# Patient Record
Sex: Male | Born: 1975 | Hispanic: Yes | Marital: Married | Smoking: Never smoker
Health system: Southern US, Community
[De-identification: ages and names within clinical notes are randomized; demographics above are authoritative.]

## PROBLEM LIST (undated history)

## (undated) DIAGNOSIS — E119 Type 2 diabetes mellitus without complications: Secondary | ICD-10-CM

## (undated) DIAGNOSIS — J189 Pneumonia, unspecified organism: Secondary | ICD-10-CM

## (undated) DIAGNOSIS — J069 Acute upper respiratory infection, unspecified: Secondary | ICD-10-CM

---
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
## 2017-08-27 DIAGNOSIS — N3000 Acute cystitis without hematuria: Secondary | ICD-10-CM | POA: Diagnosis not present

## 2018-10-24 ENCOUNTER — Encounter (HOSPITAL_COMMUNITY): Payer: Self-pay | Admitting: Emergency Medicine

## 2018-10-24 ENCOUNTER — Inpatient Hospital Stay (HOSPITAL_COMMUNITY)
Admission: EM | Admit: 2018-10-24 | Discharge: 2018-10-31 | DRG: 177 | Disposition: A | Discharge status: Home / Self Care | Payer: HRSA Program | Attending: Internal Medicine | Admitting: Internal Medicine

## 2018-10-24 ENCOUNTER — Other Ambulatory Visit: Payer: Self-pay

## 2018-10-24 DIAGNOSIS — Y92239 Unspecified place in hospital as the place of occurrence of the external cause: Secondary | ICD-10-CM | POA: Diagnosis not present

## 2018-10-24 DIAGNOSIS — A419 Sepsis, unspecified organism: Secondary | ICD-10-CM | POA: Diagnosis present

## 2018-10-24 DIAGNOSIS — Z888 Allergy status to other drugs, medicaments and biological substances status: Secondary | ICD-10-CM

## 2018-10-24 DIAGNOSIS — D696 Thrombocytopenia, unspecified: Secondary | ICD-10-CM | POA: Diagnosis present

## 2018-10-24 DIAGNOSIS — U071 COVID-19: Secondary | ICD-10-CM | POA: Diagnosis not present

## 2018-10-24 DIAGNOSIS — J1289 Other viral pneumonia: Secondary | ICD-10-CM | POA: Diagnosis present

## 2018-10-24 DIAGNOSIS — J9601 Acute respiratory failure with hypoxia: Secondary | ICD-10-CM | POA: Diagnosis present

## 2018-10-24 DIAGNOSIS — J069 Acute upper respiratory infection, unspecified: Secondary | ICD-10-CM | POA: Diagnosis present

## 2018-10-24 DIAGNOSIS — T380X5A Adverse effect of glucocorticoids and synthetic analogues, initial encounter: Secondary | ICD-10-CM | POA: Diagnosis not present

## 2018-10-24 DIAGNOSIS — Z8249 Family history of ischemic heart disease and other diseases of the circulatory system: Secondary | ICD-10-CM

## 2018-10-24 DIAGNOSIS — R7989 Other specified abnormal findings of blood chemistry: Secondary | ICD-10-CM | POA: Diagnosis present

## 2018-10-24 DIAGNOSIS — R945 Abnormal results of liver function studies: Secondary | ICD-10-CM | POA: Diagnosis present

## 2018-10-24 DIAGNOSIS — A4189 Other specified sepsis: Secondary | ICD-10-CM | POA: Diagnosis present

## 2018-10-24 DIAGNOSIS — E1165 Type 2 diabetes mellitus with hyperglycemia: Secondary | ICD-10-CM | POA: Diagnosis present

## 2018-10-24 DIAGNOSIS — Z833 Family history of diabetes mellitus: Secondary | ICD-10-CM

## 2018-10-24 NOTE — ED Triage Notes (Signed)
Reports sob and cough since last Thursday.  Tested positive for covid.

## 2018-10-24 NOTE — ED Provider Notes (Signed)
[facility] EMERGENCY DEPARTMENT Provider Note   CSN: [id number] Arrival date & time: 10/24/18  2314    History   Chief Complaint Chief Complaint  Patient presents with  . Shortness of Breath  . Cough    HPI Scott Nicholson is a 43 y.o. male with no pertinent past medical history who presents to the emergency department with a chief complaint of shortness of breath.  Patient reports that both he and his wife tested positive for COVID-19 on 6/22.  He reports that he began having symptoms 8 days ago.  Over the last week, he reports fever, chills, cough, body aches, and minimal shortness of breath.  He reports that over the last 24 hours that his shortness of breath has significantly increased.  He reports that he is getting short of breath walking from room to room.  He reports that his worsening shortness of breath is accompanied by persistent pressure-like chest pain.  Chest pain is worse with coughing episodes, but he denies exertional or pleuritic chest pain.  He also reports that his fever has been persistently higher.  He has been treating it with Tylenol and took 1 g of Tylenol approximately 40 minutes prior to arrival in the ER.  He denies leg swelling, orthopnea, nausea, vomiting, diarrhea, abdominal pain, neck pain or stiffness.   He and his wife reside in [place].  He is a never smoker.     The history is provided by the patient. No language interpreter was used.    History reviewed. No pertinent past medical history.  Patient Active Problem List   Diagnosis Date Noted  . Acute respiratory disease due to COVID-19 virus 10/25/2018  . Sepsis ([place]) 10/25/2018  . Abnormal LFTs 10/25/2018  . Thrombocytopenia ([place]) 10/25/2018    History reviewed. No pertinent surgical history.      Home Medications    Prior to Admission medications   Medication Sig Start Date End Date Taking? Authorizing Provider  acetaminophen (TYLENOL) 500 MG tablet Take 1,000 mg by  mouth every 6 (six) hours as needed for fever.   Yes [provider]    Family History Family History  Problem Relation Age of Onset  . Diabetes Mellitus II Mother   . Hypertension Mother     Social History Social History   Tobacco Use  . Smoking status: Never Smoker  . Smokeless tobacco: Never Used  Substance Use Topics  . Alcohol use: Yes    Frequency: Never    Comment: rarely  . Drug use: Never     Allergies   Patient has no known allergies.   Review of Systems Review of Systems  Constitutional: Positive for chills and fever. Negative for appetite change.  Respiratory: Positive for cough and shortness of breath.   Cardiovascular: Positive for chest pain. Negative for palpitations and leg swelling.  Gastrointestinal: Negative for abdominal pain, diarrhea, nausea and vomiting.  Genitourinary: Negative for dysuria.  Musculoskeletal: Negative for back pain.  Skin: Negative for rash.  Allergic/Immunologic: Negative for immunocompromised state.  Neurological: Negative for headaches.  Psychiatric/Behavioral: Negative for confusion.   Physical Exam Updated Vital Signs BP 132/71   Pulse (!) 101   Temp (!) 100.9 F (38.3 C) (Oral)   Resp (!) 25   Ht 5\' 10"  (1.778 m)   Wt 83.9 kg   SpO2 91%   BMI 26.54 kg/m   Physical Exam Vitals signs and nursing note reviewed.  Constitutional:      General: He is  not in acute distress.    Appearance: He is well-developed. He is not ill-appearing, toxic-appearing or diaphoretic.  HENT:     Head: Normocephalic.  Eyes:     Conjunctiva/sclera: Conjunctivae normal.  Neck:     Musculoskeletal: Normal range of motion and neck supple.  Cardiovascular:     Rate and Rhythm: Regular rhythm. Tachycardia present.     Heart sounds: No murmur.  Pulmonary:     Effort: Pulmonary effort is normal.     Comments: Significantly diminished breath sounds throughout.  Lungs are otherwise clear to auscultation bilaterally.  No  retractions or accessory muscle use. Abdominal:     General: There is no distension.     Palpations: Abdomen is soft. There is no mass.     Tenderness: There is no abdominal tenderness. There is no right CVA tenderness, left CVA tenderness, guarding or rebound.     Hernia: No hernia is present.  Musculoskeletal: Normal range of motion.     Right lower leg: No edema.     Left lower leg: No edema.  Skin:    General: Skin is warm and dry.     Capillary Refill: Capillary refill takes less than 2 seconds.     Coloration: Skin is not jaundiced or pale.     Findings: No bruising or rash.  Neurological:     Mental Status: He is alert.  Psychiatric:        Behavior: Behavior normal.      ED Treatments / Results  Labs (all labs ordered are listed, but only abnormal results are displayed) Labs Reviewed  CBC WITH DIFFERENTIAL/PLATELET - Abnormal; Notable for the following components:      Result Value   Platelets 127 (*)    Neutro Abs 7.9 (*)    All other components within normal limits  COMPREHENSIVE METABOLIC PANEL - Abnormal; Notable for the following components:   Glucose, Bld 138 (*)    Calcium 8.2 (*)    ALT 50 (*)    All other components within normal limits  D-DIMER, QUANTITATIVE (NOT AT [facility]) - Abnormal; Notable for the following components:   D-Dimer, Quant 1.09 (*)    All other components within normal limits  LACTATE DEHYDROGENASE - Abnormal; Notable for the following components:   LDH 234 (*)    All other components within normal limits  FERRITIN - Abnormal; Notable for the following components:   Ferritin 757 (*)    All other components within normal limits  FIBRINOGEN - Abnormal; Notable for the following components:   Fibrinogen 680 (*)    All other components within normal limits  C-REACTIVE PROTEIN - Abnormal; Notable for the following components:   CRP 16.5 (*)    All other components within normal limits  URINALYSIS, ROUTINE W REFLEX MICROSCOPIC - Abnormal;  Notable for the following components:   Color, Urine AMBER (*)    APPearance HAZY (*)    Protein, ur 100 (*)    All other components within normal limits  CULTURE, BLOOD (ROUTINE X 2)  CULTURE, BLOOD (ROUTINE X 2)  RESPIRATORY PANEL BY PCR  EXPECTORATED SPUTUM ASSESSMENT W REFEX TO RESP CULTURE  LACTIC ACID, PLASMA  PROCALCITONIN  TRIGLYCERIDES  STREP PNEUMONIAE URINARY ANTIGEN  LACTIC ACID, PLASMA  INFLUENZA PANEL BY PCR (TYPE A & B)  LEGIONELLA PNEUMOPHILA SEROGP 1 UR AG  HIV ANTIBODY (ROUTINE TESTING W REFLEX)  BRAIN NATRIURETIC PEPTIDE  HEPATITIS B SURFACE ANTIGEN  INTERLEUKIN-6, PLASMA  SEDIMENTATION RATE  TROPONIN I (  HIGH SENSITIVITY)  TROPONIN I (HIGH SENSITIVITY)  TYPE AND SCREEN    EKG None  Radiology Dg Chest Portable 1 View  Result Date: 10/25/2018 CLINICAL DATA:  Shortness of breath EXAM: PORTABLE CHEST 1 VIEW COMPARISON:  None. FINDINGS: There are multifocal airspace opacities bilaterally. The heart size is normal. There is no pneumothorax. No large pleural effusion. No acute osseous abnormality. No large pleural effusion. IMPRESSION: Multifocal airspace opacities concerning for multifocal pneumonia (viral or bacterial). Electronically Signed   By: Katherine Mantlehristopher  Green M.D.   On: 10/25/2018 00:31    Procedures Procedures (including critical care time)  Medications Ordered in ED Medications  AeroChamber Plus Flo-Vu Large MISC (has no administration in time range)  dextromethorphan-guaiFENesin (MUCINEX DM) 30-600 MG per 12 hr tablet 1 tablet (has no administration in time range)  cefTRIAXone (ROCEPHIN) 1 g in sodium chloride 0.9 % 100 mL IVPB (has no administration in time range)  azithromycin (ZITHROMAX) 500 mg in sodium chloride 0.9 % 250 mL IVPB (has no administration in time range)  enoxaparin (LOVENOX) injection 40 mg (has no administration in time range)  acetaminophen (TYLENOL) tablet 650 mg (650 mg Oral Given 10/25/18 0648)  ondansetron (ZOFRAN) tablet 4  mg (has no administration in time range)    Or  ondansetron (ZOFRAN) injection 4 mg (has no administration in time range)  ipratropium (ATROVENT HFA) inhaler 2 puff (has no administration in time range)  levalbuterol (XOPENEX HFA) inhaler 2 puff (has no administration in time range)  methylPREDNISolone sodium succinate (SOLU-MEDROL) 125 mg/2 mL injection 60 mg (has no administration in time range)  vitamin C (ASCORBIC ACID) tablet 1,000 mg (has no administration in time range)  cholecalciferol (VITAMIN D3) tablet 1,000 Units (has no administration in time range)  zinc sulfate capsule 220 mg (has no administration in time range)  albuterol (VENTOLIN HFA) 108 (90 Base) MCG/ACT inhaler 8 puff (8 puffs Inhalation Given 10/25/18 0156)  AeroChamber Plus Flo-Vu Large MISC 1 each (1 each Other Given 10/25/18 0156)  guaiFENesin (ROBITUSSIN) 100 MG/5ML solution 100 mg (100 mg Oral Given 10/25/18 0156)     Initial Impression / Assessment and Plan / ED Course  I have reviewed the triage vital signs and the nursing notes.  Pertinent labs & imaging results that were available during my care of the patient were reviewed by me and considered in my medical decision making (see chart for details).        43 year old male with no pertinent past medical history presenting with worsening shortness of breath and chest pain after being diagnosed with COVID-19 earlier this week.  The patient reports that his wife was also diagnosed with COVID-19 on 6/22.   Febrile to 101.1 on arrival.  Initial SaO2 was 95% on room air, but patient has gradually decreased since arrival to 92 to 93% at rest.  He has become progressively tachypneic, but has remained normotensive and without tachycardia.  He is currently not hypoxic on room air at rest.  On exam, breath sounds are diminished bilaterally in all fields.  However, patient is not in respiratory distress and does not have accessory muscle use or retractions.   Chest x-ray  with multifocal airspace opacities concerning for multifocal pneumonia.  Labs are notable for elevated LDH, ferritin, CRP, d-dimer, and fibrinogen.   I am concerned for further clinical decompensation given how rapidly the patient's symptoms have worsened over the last 24 hours.  Consult to the hospitalist team and spoke with Dr. Clyde LundborgNiu who will accept the  patient for admission. The patient appears reasonably stabilized for admission considering the current resources, flow, and capabilities available in the ED at this time, and I doubt any other [facility] requiring further screening and/or treatment in the ED prior to admission.   Final Clinical Impressions(s) / ED Diagnoses   Final diagnoses:  Acute respiratory disease due to COVID-19 virus    ED Discharge Orders    None       Barkley BoardsMcDonald, Cordarryl Monrreal A, PA-C 10/25/18 [id number]    Glynn Octaveancour, Stephen, MD 10/25/18 1926

## 2018-10-25 ENCOUNTER — Emergency Department (HOSPITAL_COMMUNITY): Payer: HRSA Program

## 2018-10-25 ENCOUNTER — Other Ambulatory Visit: Payer: Self-pay

## 2018-10-25 ENCOUNTER — Encounter (HOSPITAL_COMMUNITY): Payer: Self-pay | Admitting: Internal Medicine

## 2018-10-25 DIAGNOSIS — T380X5A Adverse effect of glucocorticoids and synthetic analogues, initial encounter: Secondary | ICD-10-CM | POA: Diagnosis not present

## 2018-10-25 DIAGNOSIS — Z833 Family history of diabetes mellitus: Secondary | ICD-10-CM | POA: Diagnosis not present

## 2018-10-25 DIAGNOSIS — J9601 Acute respiratory failure with hypoxia: Secondary | ICD-10-CM | POA: Diagnosis present

## 2018-10-25 DIAGNOSIS — R945 Abnormal results of liver function studies: Secondary | ICD-10-CM | POA: Diagnosis present

## 2018-10-25 DIAGNOSIS — A419 Sepsis, unspecified organism: Secondary | ICD-10-CM | POA: Diagnosis not present

## 2018-10-25 DIAGNOSIS — J069 Acute upper respiratory infection, unspecified: Secondary | ICD-10-CM | POA: Diagnosis present

## 2018-10-25 DIAGNOSIS — J1289 Other viral pneumonia: Secondary | ICD-10-CM | POA: Diagnosis present

## 2018-10-25 DIAGNOSIS — D696 Thrombocytopenia, unspecified: Secondary | ICD-10-CM | POA: Diagnosis present

## 2018-10-25 DIAGNOSIS — U071 COVID-19: Secondary | ICD-10-CM | POA: Diagnosis present

## 2018-10-25 DIAGNOSIS — Z888 Allergy status to other drugs, medicaments and biological substances status: Secondary | ICD-10-CM | POA: Diagnosis not present

## 2018-10-25 DIAGNOSIS — E1165 Type 2 diabetes mellitus with hyperglycemia: Secondary | ICD-10-CM | POA: Diagnosis present

## 2018-10-25 DIAGNOSIS — Z8249 Family history of ischemic heart disease and other diseases of the circulatory system: Secondary | ICD-10-CM | POA: Diagnosis not present

## 2018-10-25 DIAGNOSIS — R7989 Other specified abnormal findings of blood chemistry: Secondary | ICD-10-CM | POA: Diagnosis present

## 2018-10-25 DIAGNOSIS — A4189 Other specified sepsis: Secondary | ICD-10-CM | POA: Diagnosis present

## 2018-10-25 DIAGNOSIS — Y92239 Unspecified place in hospital as the place of occurrence of the external cause: Secondary | ICD-10-CM | POA: Diagnosis not present

## 2018-10-25 HISTORY — DX: COVID-19: U07.1

## 2018-10-25 HISTORY — DX: Acute upper respiratory infection, unspecified: J06.9

## 2018-10-25 LAB — LACTATE DEHYDROGENASE: LDH: 234 U/L — ABNORMAL HIGH (ref 98–192)

## 2018-10-25 LAB — C-REACTIVE PROTEIN: CRP: 16.5 mg/dL — ABNORMAL HIGH (ref <1.0)

## 2018-10-25 LAB — TROPONIN I (HIGH SENSITIVITY)
Troponin I (High Sensitivity): 4 ng/L (ref <18)
Troponin I (High Sensitivity): 5 ng/L (ref <18)

## 2018-10-25 LAB — CBC WITH DIFFERENTIAL/PLATELET
Abs Immature Granulocytes: 0.04 10*3/uL (ref 0.00–0.07)
Basophils Absolute: 0 10*3/uL (ref 0.0–0.1)
Basophils Relative: 0 %
Eosinophils Absolute: 0 10*3/uL (ref 0.0–0.5)
Eosinophils Relative: 0 %
HCT: 46.4 % (ref 39.0–52.0)
Hemoglobin: 15.4 g/dL (ref 13.0–17.0)
Immature Granulocytes: 0 %
Lymphocytes Relative: 17 %
Lymphs Abs: 1.7 10*3/uL (ref 0.7–4.0)
MCH: 29.3 pg (ref 26.0–34.0)
MCHC: 33.2 g/dL (ref 30.0–36.0)
MCV: 88.4 fL (ref 80.0–100.0)
Monocytes Absolute: 0.5 10*3/uL (ref 0.1–1.0)
Monocytes Relative: 5 %
Neutro Abs: 7.9 10*3/uL — ABNORMAL HIGH (ref 1.7–7.7)
Neutrophils Relative %: 78 %
Platelets: 127 10*3/uL — ABNORMAL LOW (ref 150–400)
RBC: 5.25 MIL/uL (ref 4.22–5.81)
RDW: 12.1 % (ref 11.5–15.5)
WBC: 10.2 10*3/uL (ref 4.0–10.5)
nRBC: 0 % (ref 0.0–0.2)

## 2018-10-25 LAB — PROCALCITONIN: Procalcitonin: 0.15 ng/mL

## 2018-10-25 LAB — TRIGLYCERIDES: Triglycerides: 80 mg/dL (ref <150)

## 2018-10-25 LAB — TYPE AND SCREEN
ABO/RH(D): O POS
ABO/RH(D): O POS
Antibody Screen: NEGATIVE
Antibody Screen: NEGATIVE

## 2018-10-25 LAB — D-DIMER, QUANTITATIVE: D-Dimer, Quant: 1.09 ug/mL-FEU — ABNORMAL HIGH (ref 0.00–0.50)

## 2018-10-25 LAB — BRAIN NATRIURETIC PEPTIDE: B Natriuretic Peptide: 24.9 pg/mL (ref 0.0–100.0)

## 2018-10-25 LAB — URINALYSIS, ROUTINE W REFLEX MICROSCOPIC
Bacteria, UA: NONE SEEN
Bilirubin Urine: NEGATIVE
Glucose, UA: NEGATIVE mg/dL
Hgb urine dipstick: NEGATIVE
Ketones, ur: NEGATIVE mg/dL
Leukocytes,Ua: NEGATIVE
Nitrite: NEGATIVE
Protein, ur: 100 mg/dL — AB
Specific Gravity, Urine: 1.03 (ref 1.005–1.030)
pH: 5 (ref 5.0–8.0)

## 2018-10-25 LAB — COMPREHENSIVE METABOLIC PANEL
ALT: 50 U/L — ABNORMAL HIGH (ref 0–44)
AST: 40 U/L (ref 15–41)
Albumin: 3.6 g/dL (ref 3.5–5.0)
Alkaline Phosphatase: 58 U/L (ref 38–126)
Anion gap: 11 (ref 5–15)
BUN: 8 mg/dL (ref 6–20)
CO2: 24 mmol/L (ref 22–32)
Calcium: 8.2 mg/dL — ABNORMAL LOW (ref 8.9–10.3)
Chloride: 100 mmol/L (ref 98–111)
Creatinine, Ser: 1.18 mg/dL (ref 0.61–1.24)
GFR calc Af Amer: >60 mL/min (ref >60)
GFR calc non Af Amer: >60 mL/min (ref >60)
Glucose, Bld: 138 mg/dL — ABNORMAL HIGH (ref 70–99)
Potassium: 4.2 mmol/L (ref 3.5–5.1)
Sodium: 135 mmol/L (ref 135–145)
Total Bilirubin: 0.8 mg/dL (ref 0.3–1.2)
Total Protein: 7.9 g/dL (ref 6.5–8.1)

## 2018-10-25 LAB — LACTIC ACID, PLASMA
Lactic Acid, Venous: 1 mmol/L (ref 0.5–1.9)
Lactic Acid, Venous: 1.6 mmol/L (ref 0.5–1.9)

## 2018-10-25 LAB — FERRITIN: Ferritin: 757 ng/mL — ABNORMAL HIGH (ref 24–336)

## 2018-10-25 LAB — HIV ANTIBODY (ROUTINE TESTING W REFLEX): HIV Screen 4th Generation wRfx: NONREACTIVE

## 2018-10-25 LAB — SEDIMENTATION RATE: Sed Rate: 23 mm/hr — ABNORMAL HIGH (ref 0–16)

## 2018-10-25 LAB — ABO/RH: ABO/RH(D): O POS

## 2018-10-25 LAB — STREP PNEUMONIAE URINARY ANTIGEN: Strep Pneumo Urinary Antigen: NEGATIVE

## 2018-10-25 LAB — FIBRINOGEN: Fibrinogen: 680 mg/dL — ABNORMAL HIGH (ref 210–475)

## 2018-10-25 MED ORDER — ACETAMINOPHEN 325 MG PO TABS
650.0000 mg | ORAL_TABLET | Freq: Four times a day (QID) | ORAL | Status: DC | PRN
  Administered 2018-10-25: 650 mg via ORAL
  Filled 2018-10-25: qty 2

## 2018-10-25 MED ORDER — ENOXAPARIN SODIUM 40 MG/0.4ML ~~LOC~~ SOLN
40.0000 mg | SUBCUTANEOUS | Status: DC
  Administered 2018-10-25 – 2018-10-31 (×7): 40 mg via SUBCUTANEOUS
  Filled 2018-10-25 (×7): qty 0.4

## 2018-10-25 MED ORDER — ONDANSETRON HCL 4 MG PO TABS: 4.0000 mg | ORAL_TABLET | Freq: Four times a day (QID) | ORAL | Status: DC | PRN

## 2018-10-25 MED ORDER — IPRATROPIUM BROMIDE HFA 17 MCG/ACT IN AERS: 2.0000 | INHALATION_SPRAY | RESPIRATORY_TRACT | Status: DC

## 2018-10-25 MED ORDER — LEVALBUTEROL TARTRATE 45 MCG/ACT IN AERO: 2.0000 | INHALATION_SPRAY | Freq: Four times a day (QID) | RESPIRATORY_TRACT | Status: DC | PRN

## 2018-10-25 MED ORDER — AEROCHAMBER PLUS FLO-VU LARGE MISC
1.0000 | Freq: Once | Status: AC
  Administered 2018-10-25: 1

## 2018-10-25 MED ORDER — AEROCHAMBER PLUS FLO-VU LARGE MISC
Status: AC
  Filled 2018-10-25: qty 1

## 2018-10-25 MED ORDER — METHYLPREDNISOLONE SODIUM SUCC 125 MG IJ SOLR
60.0000 mg | Freq: Two times a day (BID) | INTRAMUSCULAR | Status: DC
  Administered 2018-10-25 – 2018-10-31 (×13): 60 mg via INTRAVENOUS
  Filled 2018-10-25 (×13): qty 2

## 2018-10-25 MED ORDER — ZINC SULFATE 220 (50 ZN) MG PO CAPS
220.0000 mg | ORAL_CAPSULE | Freq: Every day | ORAL | Status: DC
  Administered 2018-10-25 – 2018-10-31 (×7): 220 mg via ORAL
  Filled 2018-10-25 (×7): qty 1

## 2018-10-25 MED ORDER — DM-GUAIFENESIN ER 30-600 MG PO TB12
1.0000 | ORAL_TABLET | Freq: Two times a day (BID) | ORAL | Status: DC | PRN
  Administered 2018-10-26 – 2018-10-28 (×4): 1 via ORAL
  Filled 2018-10-25 (×4): qty 1

## 2018-10-25 MED ORDER — LEVALBUTEROL TARTRATE 45 MCG/ACT IN AERO
2.0000 | INHALATION_SPRAY | Freq: Three times a day (TID) | RESPIRATORY_TRACT | Status: DC
  Administered 2018-10-25 – 2018-10-31 (×18): 2 via RESPIRATORY_TRACT
  Filled 2018-10-25: qty 15

## 2018-10-25 MED ORDER — SODIUM CHLORIDE 0.9 % IV SOLN
1.0000 g | INTRAVENOUS | Status: DC
  Administered 2018-10-25: 1 g via INTRAVENOUS
  Filled 2018-10-25: qty 10

## 2018-10-25 MED ORDER — VITAMIN C 500 MG PO TABS
1000.0000 mg | ORAL_TABLET | Freq: Every day | ORAL | Status: DC
  Administered 2018-10-25 – 2018-10-31 (×7): 1000 mg via ORAL
  Filled 2018-10-25 (×7): qty 2

## 2018-10-25 MED ORDER — ONDANSETRON HCL 4 MG/2ML IJ SOLN: 4.0000 mg | Freq: Four times a day (QID) | INTRAMUSCULAR | Status: DC | PRN

## 2018-10-25 MED ORDER — GUAIFENESIN 100 MG/5ML PO SOLN
5.0000 mL | Freq: Once | ORAL | Status: AC
  Administered 2018-10-25: 100 mg via ORAL
  Filled 2018-10-25: qty 5

## 2018-10-25 MED ORDER — VITAMIN D 25 MCG (1000 UNIT) PO TABS
1000.0000 [IU] | ORAL_TABLET | Freq: Every day | ORAL | Status: DC
  Administered 2018-10-26 – 2018-10-31 (×6): 1000 [IU] via ORAL
  Filled 2018-10-25 (×6): qty 1

## 2018-10-25 MED ORDER — SODIUM CHLORIDE 0.9 % IV SOLN
500.0000 mg | INTRAVENOUS | Status: DC
  Administered 2018-10-25: 500 mg via INTRAVENOUS
  Filled 2018-10-25: qty 500

## 2018-10-25 MED ORDER — IPRATROPIUM BROMIDE HFA 17 MCG/ACT IN AERS
2.0000 | INHALATION_SPRAY | Freq: Three times a day (TID) | RESPIRATORY_TRACT | Status: DC
  Administered 2018-10-25 – 2018-10-31 (×19): 2 via RESPIRATORY_TRACT
  Filled 2018-10-25: qty 12.9

## 2018-10-25 MED ORDER — ALBUTEROL SULFATE HFA 108 (90 BASE) MCG/ACT IN AERS
8.0000 | INHALATION_SPRAY | Freq: Once | RESPIRATORY_TRACT | Status: AC
  Administered 2018-10-25: 02:00:00 8 via RESPIRATORY_TRACT
  Filled 2018-10-25: qty 6.7

## 2018-10-25 NOTE — H&P (Signed)
History and Physical    Scott Nicholson ZOX:[id number] DOB: [birth year] DOA: 10/24/2018  Referring MD/NP/PA:   PCP: Patient, No Pcp Per   Patient coming from:  The patient is coming from home.  At baseline, pt is independent for most of ADL.        Chief Complaint: fever, SOB, cough  HPI: Scott DoffingFelipe Bulkley is a 43 y.o. male without significant medical history, who presents with fever, shortness breath and cough.  Patient speaks little Scott Nicholson, history is obtained with his wife's help who speaks good Scott Nicholson. He states that he has been having shortness of breath, dry cough, fever, chills, body aches, malaise in the past 8 days. Patient states that both he and his wife tested positive for COVID-19 on 6/22. He also reports intermittent chest pressure, currently he denies chest pain. He reports that over the last 24 hours that his shortness of breath has significantly worsened. He reports that he is getting short of breath walking from room to room. He also reports that his fever has been persistently high.  He has been treating himself with Tylenol, with some improvement. Patient denies nausea vomiting, diarrhea, abdominal pain, symptoms of UTI or unilateral weakness.   ED Course: pt was found to have WBC 10.2, negative urinalysis, electrolytes renal function okay, liver function (ALP 58, AST 40, ALT 50, total bilirubin 0.8), CRP 16.5, LDH 234, procalcitonin 0.015, ferritin 757, TG 80, temperature 100.9, tachycardia, tachypnea, oxygen saturation 92 to 95% on room air.  Chest x-ray showed multifocal infiltration.  Patient is admitted to telemetry bed as inpatient.  Review of Systems:   General: has fevers, chills, no body weight gain, has poor appetite, has fatigue HEENT: no blurry vision, hearing changes or sore throat Respiratory: has dyspnea, coughing, no  wheezing CV: has chest pressure, no palpitations GI: no nausea, vomiting, abdominal pain, diarrhea, constipation GU: no dysuria, burning on  urination, increased urinary frequency, hematuria  Ext: no leg edema Neuro: no unilateral weakness, numbness, or tingling, no vision change or hearing loss Skin: no rash, no skin tear. MSK: No muscle spasm, no deformity, no limitation of range of movement in spin Heme: No easy bruising.  Travel history: No recent long distant travel.  Allergy: No Known Allergies  History reviewed. No pertinent past medical history.  History reviewed. No pertinent surgical history.  Social History:  reports that he has never smoked. He has never used smokeless tobacco. He reports current alcohol use. He reports that he does not use drugs.  Family History:  Family History  Problem Relation Age of Onset  . Diabetes Mellitus II Mother   . Hypertension Mother      Prior to Admission medications   Medication Sig Start Date End Date Taking? Authorizing Provider  acetaminophen (TYLENOL) 500 MG tablet Take 1,000 mg by mouth every 6 (six) hours as needed for fever.   Yes [provider]    Physical Exam: Vitals:   10/25/18 0445 10/25/18 0500 10/25/18 0515 10/25/18 0530  BP:  139/71    Pulse: (!) 107 (!) 104 (!) 101 95  Resp: (!) 24 14 (!) 31 (!) 23  Temp:      TempSrc:      SpO2: 93% 92% 93% 92%  Weight:      Height:       General: Not in acute distress HEENT:       Eyes: PERRL, EOMI, no scleral icterus.       ENT: No discharge from the ears and  nose, no pharynx injection, no tonsillar enlargement.        Neck: No JVD, no bruit, no mass felt. Heme: No neck lymph node enlargement. Cardiac: S1/S2, RRR, No murmurs, No gallops or rubs. Respiratory: Decreased air movement bilaterally. No rales, wheezing, rhonchi or rubs. GI: Soft, nondistended, nontender, no rebound pain, no organomegaly, BS present. GU: No hematuria Ext: No pitting leg edema bilaterally. 2+DP/PT pulse bilaterally. Musculoskeletal: No joint deformities, No joint redness or warmth, no limitation of ROM in spin. Skin: No  rashes.  Neuro: Alert, oriented X3, cranial nerves II-XII grossly intact, moves all extremities normally.  Psych: Patient is not psychotic, no suicidal or hemocidal ideation.  Labs on Admission: I have personally reviewed following labs and imaging studies  CBC: Recent Labs  Lab 10/25/18 0144  WBC 10.2  NEUTROABS 7.9*  HGB 15.4  HCT 46.4  MCV 88.4  PLT 127*   Basic Metabolic Panel: Recent Labs  Lab 10/25/18 0144  NA 135  K 4.2  CL 100  CO2 24  GLUCOSE 138*  BUN 8  CREATININE 1.18  CALCIUM 8.2*   GFR: Estimated Creatinine Clearance: 84.2 mL/min (by C-G formula based on SCr of 1.18 mg/dL). Liver Function Tests: Recent Labs  Lab 10/25/18 0144  AST 40  ALT 50*  ALKPHOS 58  BILITOT 0.8  PROT 7.9  ALBUMIN 3.6   No results for input(s): LIPASE, AMYLASE in the last 168 hours. No results for input(s): AMMONIA in the last 168 hours. Coagulation Profile: No results for input(s): INR, PROTIME in the last 168 hours. Cardiac Enzymes: No results for input(s): CKTOTAL, CKMB, CKMBINDEX, TROPONINI in the last 168 hours. BNP (last 3 results) No results for input(s): PROBNP in the last 8760 hours. HbA1C: No results for input(s): HGBA1C in the last 72 hours. CBG: No results for input(s): GLUCAP in the last 168 hours. Lipid Profile: Recent Labs    10/25/18 0144  TRIG 80   Thyroid Function Tests: No results for input(s): TSH, T4TOTAL, FREET4, T3FREE, THYROIDAB in the last 72 hours. Anemia Panel: Recent Labs    10/25/18 0144  FERRITIN 757*   Urine analysis:    Component Value Date/Time   COLORURINE AMBER (A) 10/25/2018 0143   APPEARANCEUR HAZY (A) 10/25/2018 0143   LABSPEC 1.030 10/25/2018 0143   PHURINE 5.0 10/25/2018 0143   GLUCOSEU NEGATIVE 10/25/2018 0143   HGBUR NEGATIVE 10/25/2018 0143   BILIRUBINUR NEGATIVE 10/25/2018 0143   KETONESUR NEGATIVE 10/25/2018 0143   PROTEINUR 100 (A) 10/25/2018 0143   NITRITE NEGATIVE 10/25/2018 0143   LEUKOCYTESUR NEGATIVE  10/25/2018 0143   Sepsis Labs: @LABRCNTIP (procalcitonin:4,lacticidven:4) ) Recent Results (from the past 240 hour(s))  Blood Culture (routine x 2)     Status: None (Preliminary result)   Collection Time: 10/25/18  1:47 AM   Specimen: BLOOD RIGHT HAND  Result Value Ref Range Status   Specimen Description BLOOD RIGHT HAND  Final   Special Requests   Final    AEROBIC BOTTLE ONLY Blood Culture results may not be optimal due to an excessive volume of blood received in culture bottles Performed at [facility], [address] [place], [place] [id number]    Culture PENDING  Incomplete   Report Status PENDING  Incomplete     Radiological Exams on Admission: Dg Chest Portable 1 View  Result Date: 10/25/2018 CLINICAL DATA:  Shortness of breath EXAM: PORTABLE CHEST 1 VIEW COMPARISON:  None. FINDINGS: There are multifocal airspace opacities bilaterally. The heart size is normal.  There is no pneumothorax. No large pleural effusion. No acute osseous abnormality. No large pleural effusion. IMPRESSION: Multifocal airspace opacities concerning for multifocal pneumonia (viral or bacterial). Electronically Signed   By: Constance Holster M.D.   On: 10/25/2018 00:31     EKG: Independently reviewed.  Sinus rhythm, QTC 439, LAE, T wave inversion in lead III/aVF, poor R wave progression   Assessment/Plan Principal Problem:   Acute respiratory disease due to COVID-19 virus Active Problems:   Sepsis ([facility])   Abnormal LFTs   Thrombocytopenia ([facility])   Sepsis and acute respiratory disease due to COVID-19 virus: Chest x-ray showed multifocal infiltration.  Patient meets criteria for sepsis with fever, tachycardia and tachypnea.  Lactic acid is normal 1.0.  Currently hemodynamically stable.  Cannot completely rule out bacterial pneumonia, will start antibiotics.    -will admit to tele as inpt -Atrovent inhaler, PRN Xopenex inhaler - Remdesivir per pharm -IV Rocephin and azithromycin -PRN Mucinex  for cough -Follow-up flu PCR and RVP -f/u Blood culture -Gentle IV fluid:  -D-dimer, BNP,Trop, LFT, CRP, LDH, Procalcitonin, IL-6, ferritin, fibinogen, TG, Hep B SAg, HIV ab -Daily CRP, Ferritin, D-dimer, -Will ask the patient to maintain an awake prone position for 16+ hours a day, if possible, with a minimum of 2-3 hours at a time - vitamin C, zinc.   -Will attempt to maintain euvolemia to a net negative fluid status -Patient was seen wearing full PPE including: gown, gloves, head cover, N95, and face shield  Abnormal LFTs: mild.  AST 40, ALT 50, total bilirubin 0.8.  Most likely due to COVID-19 infection. -Follow-up of blood CMP  Thrombocytopenia ([place]): Platelet 127.  Most likely due to ongoing infection.  No bleeding tendency. We will follow-up with CBC    Inpatient status:  # Patient requires inpatient status due to high intensity of service, high risk for further deterioration and high frequency of surveillance required.  I certify that at the point of admission it is my clinical judgment that the patient will require inpatient hospital care spanning beyond 2 midnights from the point of admission.  . Now patient has presenting with acute respiratory disease due to COVID-19 infection.  Patient also has sepsis. . The worrisome physical exam findings include decreased air movement bilaterally. . The initial radiographic and laboratory data are worrisome because chest x-ray showed multifocal infiltration.  Patient has sepsis, elevated inflammation marker . Current medical needs: please see my assessment and plan . Predictability of an adverse outcome (risk): Patient does not have multiple comorbidities, but he has severe symptoms of shortness of breath and cough.  Most likely due to COVID-19 infection. Patient also has sepis, and we cannot completely rule out bacterial pneumonia.  Patient has oxygen desaturation intermittently down to 92% on room air, patient is at high risk of  deteriorating.  Will need to be treated in hospital for at least 2 days.    DVT ppx:  SQ Lovenox Code Status: Full code Family Communication:  Yes, patient's wife Disposition Plan:  Anticipate discharge back to previous home environment Consults called:  none Admission status:   Inpatient/tele     Date of Service 10/25/2018    [facility]   If 7PM-7AM, please contact night-coverage [email or web address] Password [facility] 10/25/2018, 5:33 AM

## 2018-10-25 NOTE — Progress Notes (Addendum)
1033  Arrived to unit via EMS.  Vitals stable.  Monitor placed.  Alert and oriented x 4.  Resp e/u.  Dyspnea on exertion.  O2 sats 92-94% on room air.  Oriented to room/unit.  Voiced understanding.  Patient has updated family on arrival and condition.  1830  Up ambulating in room on room air.  Lowest O2 sat 88%.  Improving to 92% at rest.  Dry cough exacerbated by activity.

## 2018-10-25 NOTE — ED Notes (Signed)
1st LA 1.0

## 2018-10-25 NOTE — Plan of Care (Signed)
  Problem: Education: Goal: Knowledge of General Education information will improve Description: Including pain rating scale, medication(s)/side effects and non-pharmacologic comfort measures Outcome: Progressing   Problem: Health Behavior/Discharge Planning: Goal: Ability to manage health-related needs will improve Outcome: Progressing   Problem: Clinical Measurements: Goal: Ability to maintain clinical measurements within normal limits will improve Outcome: Progressing Goal: Diagnostic test results will improve Outcome: Progressing Goal: Cardiovascular complication will be avoided Outcome: Progressing   Problem: Nutrition: Goal: Adequate nutrition will be maintained Outcome: Progressing   Problem: Coping: Goal: Level of anxiety will decrease Outcome: Progressing   Problem: Elimination: Goal: Will not experience complications related to bowel motility Outcome: Progressing Goal: Will not experience complications related to urinary retention Outcome: Progressing   Problem: Pain Managment: Goal: General experience of comfort will improve Outcome: Progressing   Problem: Safety: Goal: Ability to remain free from injury will improve Outcome: Progressing   Problem: Skin Integrity: Goal: Risk for impaired skin integrity will decrease Outcome: Progressing   Problem: Education: Goal: Knowledge of risk factors and measures for prevention of condition will improve Outcome: Progressing   Problem: Coping: Goal: Psychosocial and spiritual needs will be supported Outcome: Progressing   Problem: Respiratory: Goal: Will maintain a patent airway Outcome: Progressing Goal: Complications related to the disease process, condition or treatment will be avoided or minimized Outcome: Progressing   Problem: Clinical Measurements: Goal: Respiratory complications will improve Outcome: Not Progressing Note: Day of admission   Problem: Activity: Goal: Risk for activity intolerance  will decrease Outcome: Not Progressing

## 2018-10-25 NOTE — Progress Notes (Signed)
Tyree Fluharty is a 43 y.o. male without significant medical history, who presents with fever, shortness breath and cough.  Patient speaks little [place], history is obtained with his wife's help who speaks good [place]. He states that he has been havingshortness of breath,drycough, fever, chills, body aches, malaise in the past 8 days.Patient states thatboth he and his wife tested positive for COVID-19 on 6/22. He also reports intermittent chest pressure, currently he denies chest pain. He reports that over the last 24 hours that his shortness of breath has significantly worsened.He reports that he is getting short of breath walking from room to room.He also reports that his fever has been persistently high. He has been treating himself with Tylenol, with some improvement. Patient denies nausea vomiting, diarrhea, abdominal pain, symptoms of UTI or unilateral weakness.   10/25/18: Patient seen in the ED this morning.  Heart rate elevated on monitor in the room up to 104.  In sinus tach.  Denies chest pain or palpitations.  No diarrhea this morning.  Started on IV Solu-Medrol 60 mg twice daily, inhalers 3 times daily, vitamin C, D3, and zinc.  Possible initiation and treatment with Remdesivir at [facility].   Please refer to H&P dictated by Dr. Blaine Hamper on 10/25/2018 for further details of the assessment and plan.

## 2018-10-25 NOTE — ED Notes (Signed)
Ordered bfast 

## 2018-10-25 NOTE — ED Notes (Signed)
ED TO INPATIENT HANDOFF REPORT  ED Nurse Name and Phone #:   S Name/Age/Gender Scott Nicholson 43 y.o. male Room/Bed: 024C/024C  Code Status   Code Status: Full Code  Home/SNF/Other Home Patient oriented to: self, place, time and situation Is this baseline? Yes   Triage Complete: Triage complete  Chief Complaint CoVid positive - cough  Triage Note Reports sob and cough since last Thursday.  Tested positive for covid.   Allergies No Known Allergies  Level of Care/Admitting Diagnosis ED Disposition    ED Disposition Condition Comment   Admit  Hospital Area: [facility] [[id number]]  Level of Care: Telemetry [5]  Covid Evaluation: Confirmed COVID Positive  Isolation Risk Level: Low Risk/Droplet (Less than 4L [place] supplementation)  Diagnosis: Acute respiratory disease due to COVID-19 virus [[id number]]  Admitting Physician: Lorretta Harp [[id number]]  Attending Physician: Lorretta Harp [[id number]]  Estimated length of stay: past midnight tomorrow  Certification:: I certify this patient will need inpatient services for at least 2 midnights  PT Class (Do Not Modify): Inpatient [101]  PT Acc Code (Do Not Modify): Private [1]       B Medical/Surgery History History reviewed. No pertinent past medical history. History reviewed. No pertinent surgical history.   A IV Location/Drains/Wounds Patient Lines/Drains/Airways Status   Active Line/Drains/Airways    Name:   Placement date:   Placement time:   Site:   Days:   Peripheral IV 10/25/18 Right Antecubital   10/25/18    0203    Antecubital   less than 1   Peripheral IV 10/25/18 Left Antecubital   10/25/18    0650    Antecubital   less than 1          Intake/Output Last 24 hours  Intake/Output Summary (Last 24 hours) at 10/25/2018 0981 Last data filed at 10/25/2018 0801 Gross per 24 hour  Intake 600 ml  Output -  Net 600 ml    Labs/Imaging Results for orders placed or performed during the hospital encounter of  10/24/18 (from the past 48 hour(s))  Blood Culture (routine x 2)     Status: None (Preliminary result)   Collection Time: 10/25/18  1:35 AM   Specimen: BLOOD RIGHT ARM  Result Value Ref Range   Specimen Description BLOOD RIGHT ARM    Special Requests      BOTTLES DRAWN AEROBIC AND ANAEROBIC Blood Culture adequate volume   Culture      NO GROWTH < 12 HOURS Performed at [facility], [address] [place], [place] [address]    Report Status PENDING   Urinalysis, Routine w reflex microscopic     Status: Abnormal   Collection Time: 10/25/18  1:43 AM  Result Value Ref Range   Color, Urine AMBER (A) YELLOW    Comment: BIOCHEMICALS MAY BE AFFECTED BY COLOR   APPearance HAZY (A) CLEAR   Specific Gravity, Urine 1.030 1.005 - 1.030   pH 5.0 5.0 - 8.0   Glucose, UA NEGATIVE NEGATIVE mg/dL   Hgb urine dipstick NEGATIVE NEGATIVE   Bilirubin Urine NEGATIVE NEGATIVE   Ketones, ur NEGATIVE NEGATIVE mg/dL   Protein, ur 829 (A) NEGATIVE mg/dL   Nitrite NEGATIVE NEGATIVE   Leukocytes,Ua NEGATIVE NEGATIVE   RBC / HPF 0-5 0 - 5 RBC/hpf   WBC, UA 0-5 0 - 5 WBC/hpf   Bacteria, UA NONE SEEN NONE SEEN   Mucus PRESENT     Comment: Performed at [facility], [address] [place], [place] [address]  Lactic acid, plasma     Status: None   Collection Time: 10/25/18  1:44 AM  Result Value Ref Range   Lactic Acid, Venous 1.0 0.5 - 1.9 mmol/L    Comment: Performed at [facility], [address] [place], [place] [address]  CBC WITH DIFFERENTIAL     Status: Abnormal   Collection Time: 10/25/18  1:44 AM  Result Value Ref Range   WBC 10.2 4.0 - 10.5 K/uL   RBC 5.25 4.22 - 5.81 MIL/uL   Hemoglobin 15.4 13.0 - 17.0 g/dL   HCT 09.846.4 11.939.0 - 14.752.0 %   MCV 88.4 80.0 - 100.0 fL   MCH 29.3 26.0 - 34.0 pg   MCHC 33.2 30.0 - 36.0 g/dL   RDW 82.912.1 56.211.5 - 13.015.5 %   Platelets 127 (L) 150 - 400 K/uL    Comment: REPEATED TO VERIFY   nRBC 0.0 0.0 - 0.2 %   Neutrophils Relative % 78 %   Neutro  Abs 7.9 (H) 1.7 - 7.7 K/uL   Lymphocytes Relative 17 %   Lymphs Abs 1.7 0.7 - 4.0 K/uL   Monocytes Relative 5 %   Monocytes Absolute 0.5 0.1 - 1.0 K/uL   Eosinophils Relative 0 %   Eosinophils Absolute 0.0 0.0 - 0.5 K/uL   Basophils Relative 0 %   Basophils Absolute 0.0 0.0 - 0.1 K/uL   Immature Granulocytes 0 %   Abs Immature Granulocytes 0.04 0.00 - 0.07 K/uL    Comment: Performed at [facility], [address] [place], [place] [address]  Comprehensive metabolic panel     Status: Abnormal   Collection Time: 10/25/18  1:44 AM  Result Value Ref Range   Sodium 135 135 - 145 mmol/L   Potassium 4.2 3.5 - 5.1 mmol/L   Chloride 100 98 - 111 mmol/L   CO2 24 22 - 32 mmol/L   Glucose, Bld 138 (H) 70 - 99 mg/dL   BUN 8 6 - 20 mg/dL   Creatinine, Ser 4.691.18 0.61 - 1.24 mg/dL   Calcium 8.2 (L) 8.9 - 10.3 mg/dL   Total Protein 7.9 6.5 - 8.1 g/dL   Albumin 3.6 3.5 - 5.0 g/dL   AST 40 15 - 41 U/L   ALT 50 (H) 0 - 44 U/L   Alkaline Phosphatase 58 38 - 126 U/L   Total Bilirubin 0.8 0.3 - 1.2 mg/dL   GFR calc non Af Amer >60 >60 mL/min   GFR calc Af Amer >60 >60 mL/min   Anion gap 11 5 - 15    Comment: Performed at [facility], [address] [place], [place] [address]  D-dimer, quantitative     Status: Abnormal   Collection Time: 10/25/18  1:44 AM  Result Value Ref Range   D-Dimer, Quant 1.09 (H) 0.00 - 0.50 ug/mL-FEU    Comment: (NOTE) At the manufacturer cut-off of 0.50 ug/mL FEU, this assay has been documented to exclude PE with a sensitivity and negative predictive value of 97 to 99%.  At this time, this assay has not been approved by the FDA to exclude DVT/VTE. Results should be correlated with clinical presentation. Performed at [facility], [address] [place], [place] [address]   Procalcitonin     Status: None   Collection Time: 10/25/18  1:44 AM  Result Value Ref Range   Procalcitonin 0.15 ng/mL    Comment:  Interpretation: PCT  (Procalcitonin) <= 0.5 ng/mL: Systemic infection (sepsis) is not likely. Local bacterial infection is possible. (NOTE)       Sepsis PCT Algorithm           Lower Respiratory Tract                                      Infection PCT Algorithm    ----------------------------     ----------------------------         PCT < 0.25 ng/mL                PCT < 0.10 ng/mL         Strongly encourage             Strongly discourage   discontinuation of antibiotics    initiation of antibiotics    ----------------------------     -----------------------------       PCT 0.25 - 0.50 ng/mL            PCT 0.10 - 0.25 ng/mL               OR       >80% decrease in PCT            Discourage initiation of                                            antibiotics      Encourage discontinuation           of antibiotics    ----------------------------     -----------------------------         PCT >= 0.50 ng/mL              PCT 0.26 - 0.50 ng/mL               AND        <80% decrease in PCT             Encourage initiation of                                             antibiotics       Encourage continuation           of antibiotics    ----------------------------     -----------------------------        PCT >= 0.50 ng/mL                  PCT > 0.50 ng/mL               AND         increase in PCT                  Strongly encourage                                      initiation of antibiotics    Strongly encourage escalation           of antibiotics                                     -----------------------------  PCT <= 0.25 ng/mL                                                 OR                                        > 80% decrease in PCT                                     Discontinue / Do not initiate                                             antibiotics Performed at [facility], [address] [place], [place] [address]   Lactate dehydrogenase      Status: Abnormal   Collection Time: 10/25/18  1:44 AM  Result Value Ref Range   LDH 234 (H) 98 - 192 U/L    Comment: Performed at [facility], [address] [place], [place] [address]  Ferritin     Status: Abnormal   Collection Time: 10/25/18  1:44 AM  Result Value Ref Range   Ferritin 757 (H) 24 - 336 ng/mL    Comment: Performed at [facility], [address] [place], [place] [address]  Triglycerides     Status: None   Collection Time: 10/25/18  1:44 AM  Result Value Ref Range   Triglycerides 80 <150 mg/dL    Comment: Performed at [facility], [address] [place], [place] [address]  Fibrinogen     Status: Abnormal   Collection Time: 10/25/18  1:44 AM  Result Value Ref Range   Fibrinogen 680 (H) 210 - 475 mg/dL    Comment: Performed at [facility], [address] [place], [place] [address]  C-reactive protein     Status: Abnormal   Collection Time: 10/25/18  1:44 AM  Result Value Ref Range   CRP 16.5 (H) <1.0 mg/dL    Comment: Performed at [facility], [address] [place], [place] [address]  Blood Culture (routine x 2)     Status: None (Preliminary result)   Collection Time: 10/25/18  1:47 AM   Specimen: BLOOD RIGHT HAND  Result Value Ref Range   Specimen Description BLOOD RIGHT HAND    Special Requests      AEROBIC BOTTLE ONLY Blood Culture results may not be optimal due to an excessive volume of blood received in culture bottles   Culture      NO GROWTH < 12 HOURS Performed at [facility], [address] [place], [place] [address]    Report Status PENDING   Strep pneumoniae urinary antigen     Status: None   Collection Time: 10/25/18  4:34 AM  Result Value Ref Range   Strep Pneumo Urinary Antigen NEGATIVE NEGATIVE    Comment:        Infection due to S. pneumoniae cannot be absolutely ruled out since the antigen present may be below the detection limit of the test.  Performed at [facility], [address] [place], [place] [address]   Lactic acid, plasma     Status: None   Collection Time: 10/25/18  6:10 AM  Result Value Ref Range   Lactic Acid, Venous 1.6 0.5 - 1.9 mmol/L    Comment: Performed at [facility], [address] [place], [place] [address]  Brain natriuretic peptide     Status: None   Collection Time: 10/25/18  6:10 AM  Result Value Ref Range   B Natriuretic Peptide 24.9 0.0 - 100.0 pg/mL    Comment: Performed at [facility], [address] [place], [place] [address]  Sedimentation rate     Status: Abnormal   Collection Time: 10/25/18  6:10 AM  Result Value Ref Range   Sed Rate 23 (H) 0 - 16 mm/hr    Comment: Performed at [facility], [address], [place], [place] [address]  Troponin I (High Sensitivity)     Status: None   Collection Time: 10/25/18  6:10 AM  Result Value Ref Range   Troponin I (High Sensitivity) 4 <18 ng/L    Comment: (NOTE) Elevated high sensitivity troponin I (hsTnI) values and significant  changes across serial measurements may suggest ACS but many other  chronic and acute conditions are known to elevate hsTnI results.  Refer to the "Links" section for chest pain algorithms and additional  guidance. Performed at [facility], [address], [place], [place] [address]   Type and screen [facility]     Status: None   Collection Time: 10/25/18  6:25 AM  Result Value Ref Range   ABO/RH(D) O POS    Antibody Screen NEG    Sample Expiration      10/28/2018,2359 Performed at [facility], [address] [place], [place] [address]   ABO/Rh     Status: None (Preliminary result)   Collection Time: 10/25/18  6:25 AM  Result Value Ref Range   ABO/RH(D)      O POS Performed at [facility], [address], [place], [place] [address]    Dg Chest Portable 1 View  Result Date: 10/25/2018 CLINICAL DATA:  Shortness of breath EXAM: PORTABLE CHEST 1 VIEW COMPARISON:  None. FINDINGS: There are  multifocal airspace opacities bilaterally. The heart size is normal. There is no pneumothorax. No large pleural effusion. No acute osseous abnormality. No large pleural effusion. IMPRESSION: Multifocal airspace opacities concerning for multifocal pneumonia (viral or bacterial). Electronically Signed   By: Katherine Mantle M.D.   On: 10/25/2018 00:31    Pending Labs Unresulted Labs (From admission, onward)    Start     Ordered   10/26/18 0500  C-reactive protein  Daily,   R     10/25/18 0449   10/26/18 0500  CK  Daily,   R     10/25/18 0449   10/26/18 0500  D-dimer, quantitative (not at [facility])  Daily,   R     10/25/18 0449   10/26/18 0500  Ferritin  Daily,   R     10/25/18 0449   10/26/18 0500  Interleukin-6, Plasma  Daily,   R     10/25/18 0449   10/26/18 0500  Triglycerides  Daily,   R     10/25/18 0449   10/26/18 0500  Comprehensive metabolic panel  Tomorrow morning,   R     10/25/18 0627   10/26/18  0500  CBC  Tomorrow morning,   R     10/25/18 0627   10/25/18 0449  Hepatitis B surface antigen  Once,   STAT     10/25/18 0449   10/25/18 0449  Interleukin-6, Plasma  Once,   STAT     10/25/18 0449   10/25/18 0449  Troponin I (High Sensitivity)  STAT Now then every 2 hours,   STAT     10/25/18 0449   10/25/18 0447  HIV antibody (Routine Testing)  Once,   STAT     10/25/18 0449   10/25/18 0359  Respiratory Panel by PCR  (Respiratory virus panel with precautions)  Add-on,   AD     10/25/18 0358   10/25/18 0359  Legionella Pneumophila Serogp 1 Ur Ag  Once,   STAT     10/25/18 0359   10/25/18 0359  Culture, sputum-assessment  Once,   R     10/25/18 0359   10/25/18 0358  Influenza panel by PCR (type A & B)  Add-on,   AD     10/25/18 0358          Vitals/Pain Today's Vitals   10/25/18 0530 10/25/18 0600 10/25/18 0800 10/25/18 0805  BP:  132/71 137/72 135/73  Pulse: 95 (!) 101 (!) 113 (!) 109  Resp: (!) 23 (!) 25 (!) 34 (!) 24  Temp:    (!) 100.6 F (38.1 C)  TempSrc:     Oral  SpO2: 92% 91% 93% 93%  Weight:      Height:      PainSc:    0-No pain    Isolation Precautions Airborne and Contact precautions  Medications Medications  AeroChamber Plus Flo-Vu Large MISC (has no administration in time range)  dextromethorphan-guaiFENesin (MUCINEX DM) 30-600 MG per 12 hr tablet 1 tablet (has no administration in time range)  cefTRIAXone (ROCEPHIN) 1 g in sodium chloride 0.9 % 100 mL IVPB (0 g Intravenous Stopped 10/25/18 0801)  azithromycin (ZITHROMAX) 500 mg in sodium chloride 0.9 % 250 mL IVPB (0 mg Intravenous Stopped 10/25/18 0801)  enoxaparin (LOVENOX) injection 40 mg (40 mg Subcutaneous Given 10/25/18 0847)  acetaminophen (TYLENOL) tablet 650 mg (650 mg Oral Given 10/25/18 0648)  ondansetron (ZOFRAN) tablet 4 mg (has no administration in time range)    Or  ondansetron (ZOFRAN) injection 4 mg (has no administration in time range)  ipratropium (ATROVENT HFA) inhaler 2 puff (2 puffs Inhalation Given 10/25/18 0802)  levalbuterol (XOPENEX HFA) inhaler 2 puff (has no administration in time range)  methylPREDNISolone sodium succinate (SOLU-MEDROL) 125 mg/2 mL injection 60 mg (60 mg Intravenous Given 10/25/18 0759)  vitamin C (ASCORBIC ACID) tablet 1,000 mg (has no administration in time range)  cholecalciferol (VITAMIN D3) tablet 1,000 Units (has no administration in time range)  zinc sulfate capsule 220 mg (has no administration in time range)  albuterol (VENTOLIN HFA) 108 (90 Base) MCG/ACT inhaler 8 puff (8 puffs Inhalation Given 10/25/18 0156)  AeroChamber Plus Flo-Vu Large MISC 1 each (1 each Other Given 10/25/18 0156)  guaiFENesin (ROBITUSSIN) 100 MG/5ML solution 100 mg (100 mg Oral Given 10/25/18 0156)    Mobility walks Low fall risk   Focused Assessments Pulmonary Assessment Handoff:  Lung sounds: Bilateral Breath Sounds: Diminished L Breath Sounds: Clear R Breath Sounds: Clear O2 Device: Room Air        R Recommendations: See Admitting Provider  Note  Report given to:   Additional Notes:

## 2018-10-25 NOTE — ED Notes (Signed)
Called [facility] to check on status of pt transport.  Proceeded to called [facility] to check on availability for pt transport. Advised that crews would be in route.   

## 2018-10-25 NOTE — ED Notes (Signed)
Called [facility], will be after shift change before picked up

## 2018-10-25 NOTE — Progress Notes (Signed)
Pt wife is in room 136.  Updated family member in person.

## 2018-10-25 NOTE — ED Notes (Signed)
ED TO INPATIENT HANDOFF REPORT  ED Nurse Name and Phone #:  [phone] Scott Nicholson  S Name/Age/Gender Scott Nicholson 43 y.o. male Room/Bed: 024C/024C  Code Status   Code Status: Full Code  Home/SNF/Other Home Patient oriented to: self, place, time and situation Is this baseline? Yes   Triage Complete: Triage complete  Chief Complaint CoVid positive - cough  Triage Note Reports sob and cough since last Thursday.  Tested positive for covid.   Allergies No Known Allergies  Level of Care/Admitting Diagnosis ED Disposition    ED Disposition Condition Comment   Admit  Hospital Area: [facility] [[id number]]  Level of Care: Telemetry [5]  Covid Evaluation: Confirmed COVID Positive  Isolation Risk Level: Low Risk/Droplet (Less than 4L  supplementation)  Diagnosis: Acute respiratory disease due to COVID-19 virus [[id number]]  Admitting Physician: Lorretta Harp [[id number]]  Attending Physician: Lorretta Harp [[id number]]  Estimated length of stay: past midnight tomorrow  Certification:: I certify this patient will need inpatient services for at least 2 midnights  PT Class (Do Not Modify): Inpatient [101]  PT Acc Code (Do Not Modify): Private [1]       B Medical/Surgery History History reviewed. No pertinent past medical history. History reviewed. No pertinent surgical history.   A IV Location/Drains/Wounds Patient Lines/Drains/Airways Status   Active Line/Drains/Airways    Name:   Placement date:   Placement time:   Site:   Days:   Peripheral IV 10/25/18 Right Antecubital   10/25/18    0203    Antecubital   less than 1          Intake/Output Last 24 hours No intake or output data in the 24 hours ending 10/25/18 5366  Labs/Imaging Results for orders placed or performed during the hospital encounter of 10/24/18 (from the past 48 hour(s))  Urinalysis, Routine w reflex microscopic     Status: Abnormal   Collection Time: 10/25/18  1:43 AM  Result Value Ref Range   Color, Urine AMBER (A) YELLOW    Comment: BIOCHEMICALS MAY BE AFFECTED BY COLOR   APPearance HAZY (A) CLEAR   Specific Gravity, Urine 1.030 1.005 - 1.030   pH 5.0 5.0 - 8.0   Glucose, UA NEGATIVE NEGATIVE mg/dL   Hgb urine dipstick NEGATIVE NEGATIVE   Bilirubin Urine NEGATIVE NEGATIVE   Ketones, ur NEGATIVE NEGATIVE mg/dL   Protein, ur 440 (A) NEGATIVE mg/dL   Nitrite NEGATIVE NEGATIVE   Leukocytes,Ua NEGATIVE NEGATIVE   RBC / HPF 0-5 0 - 5 RBC/hpf   WBC, UA 0-5 0 - 5 WBC/hpf   Bacteria, UA NONE SEEN NONE SEEN   Mucus PRESENT     Comment: Performed at [facility], [address] [place], [place] [address]  Lactic acid, plasma     Status: None   Collection Time: 10/25/18  1:44 AM  Result Value Ref Range   Lactic Acid, Venous 1.0 0.5 - 1.9 mmol/L    Comment: Performed at [facility], [address], [place], [place] [address]  CBC WITH DIFFERENTIAL     Status: Abnormal   Collection Time: 10/25/18  1:44 AM  Result Value Ref Range   WBC 10.2 4.0 - 10.5 K/uL   RBC 5.25 4.22 - 5.81 MIL/uL   Hemoglobin 15.4 13.0 - 17.0 g/dL   HCT 87.5 64.3 - 32.9 %   MCV 88.4 80.0 - 100.0 fL   MCH 29.3 26.0 - 34.0 pg   MCHC 33.2 30.0 - 36.0 g/dL  RDW 12.1 11.5 - 15.5 %   Platelets 127 (L) 150 - 400 K/uL    Comment: REPEATED TO VERIFY   nRBC 0.0 0.0 - 0.2 %   Neutrophils Relative % 78 %   Neutro Abs 7.9 (H) 1.7 - 7.7 K/uL   Lymphocytes Relative 17 %   Lymphs Abs 1.7 0.7 - 4.0 K/uL   Monocytes Relative 5 %   Monocytes Absolute 0.5 0.1 - 1.0 K/uL   Eosinophils Relative 0 %   Eosinophils Absolute 0.0 0.0 - 0.5 K/uL   Basophils Relative 0 %   Basophils Absolute 0.0 0.0 - 0.1 K/uL   Immature Granulocytes 0 %   Abs Immature Granulocytes 0.04 0.00 - 0.07 K/uL    Comment: Performed at [facility], [address] [place], [place] [address]  Comprehensive metabolic panel     Status: Abnormal   Collection Time: 10/25/18  1:44 AM  Result Value Ref Range   Sodium 135 135 - 145  mmol/L   Potassium 4.2 3.5 - 5.1 mmol/L   Chloride 100 98 - 111 mmol/L   CO2 24 22 - 32 mmol/L   Glucose, Bld 138 (H) 70 - 99 mg/dL   BUN 8 6 - 20 mg/dL   Creatinine, Ser 6.041.18 0.61 - 1.24 mg/dL   Calcium 8.2 (L) 8.9 - 10.3 mg/dL   Total Protein 7.9 6.5 - 8.1 g/dL   Albumin 3.6 3.5 - 5.0 g/dL   AST 40 15 - 41 U/L   ALT 50 (H) 0 - 44 U/L   Alkaline Phosphatase 58 38 - 126 U/L   Total Bilirubin 0.8 0.3 - 1.2 mg/dL   GFR calc non Af Amer >60 >60 mL/min   GFR calc Af Amer >60 >60 mL/min   Anion gap 11 5 - 15    Comment: Performed at [facility], [address] [place], [place] [address]  D-dimer, quantitative     Status: Abnormal   Collection Time: 10/25/18  1:44 AM  Result Value Ref Range   D-Dimer, Quant 1.09 (H) 0.00 - 0.50 ug/mL-FEU    Comment: (NOTE) At the manufacturer cut-off of 0.50 ug/mL FEU, this assay has been documented to exclude PE with a sensitivity and negative predictive value of 97 to 99%.  At this time, this assay has not been approved by the FDA to exclude DVT/VTE. Results should be correlated with clinical presentation. Performed at [facility], [address] [place], [place] [address]   Procalcitonin     Status: None   Collection Time: 10/25/18  1:44 AM  Result Value Ref Range   Procalcitonin 0.15 ng/mL    Comment:        Interpretation: PCT (Procalcitonin) <= 0.5 ng/mL: Systemic infection (sepsis) is not likely. Local bacterial infection is possible. (NOTE)       Sepsis PCT Algorithm           Lower Respiratory Tract                                      Infection PCT Algorithm    ----------------------------     ----------------------------         PCT < 0.25 ng/mL                PCT < 0.10 ng/mL         Strongly encourage  Strongly discourage   discontinuation of antibiotics    initiation of antibiotics    ----------------------------     -----------------------------       PCT 0.25 - 0.50 ng/mL            PCT 0.10 - 0.25  ng/mL               OR       >80% decrease in PCT            Discourage initiation of                                            antibiotics      Encourage discontinuation           of antibiotics    ----------------------------     -----------------------------         PCT >= 0.50 ng/mL              PCT 0.26 - 0.50 ng/mL               AND        <80% decrease in PCT             Encourage initiation of                                             antibiotics       Encourage continuation           of antibiotics    ----------------------------     -----------------------------        PCT >= 0.50 ng/mL                  PCT > 0.50 ng/mL               AND         increase in PCT                  Strongly encourage                                      initiation of antibiotics    Strongly encourage escalation           of antibiotics                                     -----------------------------                                           PCT <= 0.25 ng/mL                                                 OR                                        >  80% decrease in PCT                                     Discontinue / Do not initiate                                             antibiotics Performed at [facility], [address] [place], [place] [address]   Lactate dehydrogenase     Status: Abnormal   Collection Time: 10/25/18  1:44 AM  Result Value Ref Range   LDH 234 (H) 98 - 192 U/L    Comment: Performed at [facility], [address] [place], [place] [address]  Ferritin     Status: Abnormal   Collection Time: 10/25/18  1:44 AM  Result Value Ref Range   Ferritin 757 (H) 24 - 336 ng/mL    Comment: Performed at [facility], [address] [place], [place] [address]  Triglycerides     Status: None   Collection Time: 10/25/18  1:44 AM  Result Value Ref Range   Triglycerides 80 <150 mg/dL    Comment: Performed at [facility], [address]  [place], [place] [address]  Fibrinogen     Status: Abnormal   Collection Time: 10/25/18  1:44 AM  Result Value Ref Range   Fibrinogen 680 (H) 210 - 475 mg/dL    Comment: Performed at [facility], [address] [place], [place] [address]  C-reactive protein     Status: Abnormal   Collection Time: 10/25/18  1:44 AM  Result Value Ref Range   CRP 16.5 (H) <1.0 mg/dL    Comment: Performed at [facility], [address] [place], [place] [address]  Blood Culture (routine x 2)     Status: None (Preliminary result)   Collection Time: 10/25/18  1:47 AM   Specimen: BLOOD RIGHT HAND  Result Value Ref Range   Specimen Description BLOOD RIGHT HAND    Special Requests      AEROBIC BOTTLE ONLY Blood Culture results may not be optimal due to an excessive volume of blood received in culture bottles Performed at [facility], [address] [place], [place] [address]    Culture PENDING    Report Status PENDING    Dg Chest Portable 1 View  Result Date: 10/25/2018 CLINICAL DATA:  Shortness of breath EXAM: PORTABLE CHEST 1 VIEW COMPARISON:  None. FINDINGS: There are multifocal airspace opacities bilaterally. The heart size is normal. There is no pneumothorax. No large pleural effusion. No acute osseous abnormality. No large pleural effusion. IMPRESSION: Multifocal airspace opacities concerning for multifocal pneumonia (viral or bacterial). Electronically Signed   By: Katherine Mantlehristopher  Green M.D.   On: 10/25/2018 00:31    Pending Labs Unresulted Labs (From admission, onward)    Start     Ordered   10/26/18 0500  C-reactive protein  Daily,   R     10/25/18 0449   10/26/18 0500  CK  Daily,   R     10/25/18 0449   10/26/18 0500  D-dimer, quantitative (not at [facility])  Daily,   R     10/25/18 0449   10/26/18 0500  Ferritin  Daily,   R  10/25/18 0449   10/26/18 0500  Interleukin-6, Plasma  Daily,   R     10/25/18 0449   10/26/18 0500  Triglycerides  Daily,   R     10/25/18 0449   10/25/18  0449  Brain natriuretic peptide  Once,   STAT     10/25/18 0449   10/25/18 0449  Hepatitis B surface antigen  Once,   STAT     10/25/18 0449   10/25/18 0449  Interleukin-6, Plasma  Once,   STAT     10/25/18 0449   10/25/18 0449  Sedimentation rate  Once,   STAT     10/25/18 0449   10/25/18 0449  Troponin I (High Sensitivity)  STAT Now then every 2 hours,   STAT     10/25/18 0449   10/25/18 0448  Type and screen [facility]  Once,   STAT    Comments: [facility]    10/25/18 0449   10/25/18 0447  HIV antibody (Routine Testing)  Once,   STAT     10/25/18 0449   10/25/18 0359  Respiratory Panel by PCR  (Respiratory virus panel with precautions)  Add-on,   AD     10/25/18 0358   10/25/18 0359  Legionella Pneumophila Serogp 1 Ur Ag  Once,   STAT     10/25/18 0359   10/25/18 0359  Strep pneumoniae urinary antigen  Once,   STAT     10/25/18 0359   10/25/18 0359  Culture, sputum-assessment  Once,   R     10/25/18 0359   10/25/18 0358  Influenza panel by PCR (type A & B)  Add-on,   AD     10/25/18 0358   10/25/18 0054  Lactic acid, plasma  Now then every 2 hours,   STAT     10/25/18 0057   10/25/18 0054  Blood Culture (routine x 2)  BLOOD CULTURE X 2,   STAT     10/25/18 0057          Vitals/Pain Today's Vitals   10/25/18 0315 10/25/18 0330 10/25/18 0345 10/25/18 0355  BP:      Pulse: 94 100 97   Resp: (!) 27 (!) 22 (!) 26   Temp:      TempSrc:      SpO2: 94% 95% 93%   Weight:      Height:      PainSc:    0-No pain    Isolation Precautions Airborne and Contact precautions  Medications Medications  AeroChamber Plus Flo-Vu Large MISC (has no administration in time range)  ipratropium (ATROVENT HFA) inhaler 2 puff (has no administration in time range)  levalbuterol (XOPENEX HFA) inhaler 2 puff (has no administration in time range)  dextromethorphan-guaiFENesin (MUCINEX DM) 30-600 MG per 12 hr tablet 1 tablet (has no administration in time  range)  cefTRIAXone (ROCEPHIN) 1 g in sodium chloride 0.9 % 100 mL IVPB (has no administration in time range)  azithromycin (ZITHROMAX) 500 mg in sodium chloride 0.9 % 250 mL IVPB (has no administration in time range)  enoxaparin (LOVENOX) injection 40 mg (has no administration in time range)  acetaminophen (TYLENOL) tablet 650 mg (has no administration in time range)  ondansetron (ZOFRAN) tablet 4 mg (has no administration in time range)    Or  ondansetron (ZOFRAN) injection 4 mg (has no administration in time range)  albuterol (VENTOLIN HFA) 108 (90 Base) MCG/ACT inhaler 8 puff (8 puffs Inhalation Given 10/25/18  0156)  AeroChamber Plus Flo-Vu Large MISC 1 each (1 each Other Given 10/25/18 0156)  guaiFENesin (ROBITUSSIN) 100 MG/5ML solution 100 mg (100 mg Oral Given 10/25/18 0156)    Mobility walks Low fall risk   Focused Assessments Pulmonary Assessment Handoff:  Lung sounds: Bilateral Breath Sounds: Diminished L Breath Sounds: Clear R Breath Sounds: Clear O2 Device: Room Air        R Recommendations: See Admitting Provider Note  Report given to:   Additional Notes:

## 2018-10-26 LAB — CBC
HCT: 43.5 % (ref 39.0–52.0)
Hemoglobin: 14.3 g/dL (ref 13.0–17.0)
MCH: 29.4 pg (ref 26.0–34.0)
MCHC: 32.9 g/dL (ref 30.0–36.0)
MCV: 89.3 fL (ref 80.0–100.0)
Platelets: 172 10*3/uL (ref 150–400)
RBC: 4.87 MIL/uL (ref 4.22–5.81)
RDW: 12.1 % (ref 11.5–15.5)
WBC: 14.5 10*3/uL — ABNORMAL HIGH (ref 4.0–10.5)
nRBC: 0 % (ref 0.0–0.2)

## 2018-10-26 LAB — COMPREHENSIVE METABOLIC PANEL
ALT: 39 U/L (ref 0–44)
AST: 28 U/L (ref 15–41)
Albumin: 3.4 g/dL — ABNORMAL LOW (ref 3.5–5.0)
Alkaline Phosphatase: 54 U/L (ref 38–126)
Anion gap: 11 (ref 5–15)
BUN: 18 mg/dL (ref 6–20)
CO2: 24 mmol/L (ref 22–32)
Calcium: 8.6 mg/dL — ABNORMAL LOW (ref 8.9–10.3)
Chloride: 104 mmol/L (ref 98–111)
Creatinine, Ser: 0.82 mg/dL (ref 0.61–1.24)
GFR calc Af Amer: >60 mL/min (ref >60)
GFR calc non Af Amer: >60 mL/min (ref >60)
Glucose, Bld: 206 mg/dL — ABNORMAL HIGH (ref 70–99)
Potassium: 4.2 mmol/L (ref 3.5–5.1)
Sodium: 139 mmol/L (ref 135–145)
Total Bilirubin: 0.7 mg/dL (ref 0.3–1.2)
Total Protein: 7.5 g/dL (ref 6.5–8.1)

## 2018-10-26 LAB — FERRITIN: Ferritin: 854 ng/mL — ABNORMAL HIGH (ref 24–336)

## 2018-10-26 LAB — D-DIMER, QUANTITATIVE: D-Dimer, Quant: 0.95 ug/mL-FEU — ABNORMAL HIGH (ref 0.00–0.50)

## 2018-10-26 LAB — ABO/RH: ABO/RH(D): O POS

## 2018-10-26 LAB — C-REACTIVE PROTEIN: CRP: 18.1 mg/dL — ABNORMAL HIGH (ref <1.0)

## 2018-10-26 LAB — INTERLEUKIN-6, PLASMA: Interleukin-6, Plasma: 190.8 pg/mL — ABNORMAL HIGH (ref 0.0–12.2)

## 2018-10-26 NOTE — Progress Notes (Signed)
PROGRESS NOTE                                                                                                                                                                                                             Patient Demographics:    Scott Nicholson, is a 43 y.o. male, DOB - 11-21-1975, ZOX:[id number]  Outpatient Primary MD for the patient is Patient, No Pcp Per    LOS - 1  Chief Complaint  Patient presents with  . Shortness of Breath  . Cough       Brief Narrative: Patient is a 43 y.o. male with no past medical history-presented with fever, cough-found to have COVID-19 viral pneumonia.   Subjective:   Feels better-continues to have coughing spells-O2 saturations hovering between 87 to 92% on room air this morning.   Assessment  & Plan :   COVID-19 pneumonia: Appears to be developing mild hypoxia-inflammatory markers are significantly elevated.  Continue steroids-monitor closely-if stays persistently hypoxic or worsens-we will need to start Remdesivir.  COVID-19 Labs:  Recent Labs    10/25/18 0144 10/26/18 0452  DDIMER 1.09* 0.95*  FERRITIN 757* 854*  LDH 234*  --   CRP 16.5* 18.1*    No results found for: SARSCOV2NAA   COVID-19 Medications: 6/27>> Solu-Medrol   ABG: No results found for: PHART, PCO2ART, PO2ART, HCO3, TCO2, ACIDBASEDEF, O2SAT  Condition -Stable  Family Communication  :  None at bedside  Code Status :  Full Code  Diet :  Diet Order            Diet regular Room service appropriate? Yes; Fluid consistency: Thin  Diet effective now               Disposition Plan  :  Remain inpatient  DVT Prophylaxis  :  Lovenox   Lab Results  Component Value Date   PLT 172 10/26/2018    Inpatient Medications  Scheduled Meds: . cholecalciferol  1,000 Units Oral Daily  . enoxaparin (LOVENOX) injection  40 mg Subcutaneous Q24H  . ipratropium  2 puff Inhalation TID  .  levalbuterol  2 puff Inhalation Q8H  . methylPREDNISolone (SOLU-MEDROL) injection  60 mg Intravenous Q12H  . vitamin C  1,000 mg Oral Daily  . zinc sulfate  220 mg Oral Daily   Continuous Infusions: PRN Meds:.acetaminophen, dextromethorphan-guaiFENesin, ondansetron **  OR** ondansetron (ZOFRAN) IV  Antibiotics  :    Anti-infectives (From admission, onward)   Start     Dose/Rate Route Frequency Ordered Stop   10/25/18 0500  azithromycin (ZITHROMAX) 500 mg in sodium chloride 0.9 % 250 mL IVPB  Status:  Discontinued     500 mg 250 mL/hr over 60 Minutes Intravenous Every 24 hours 10/25/18 0359 10/25/18 1059   10/25/18 0400  cefTRIAXone (ROCEPHIN) 1 g in sodium chloride 0.9 % 100 mL IVPB  Status:  Discontinued     1 g 200 mL/hr over 30 Minutes Intravenous Every 24 hours 10/25/18 0359 10/25/18 1059       Time Spent in minutes  25   Oren Binet M.D on 10/26/2018 at 11:25 AM  To page go to [email or web address] - use universal password  [facility] -  Office  [phone]  See all Orders from today for further details   Admit date - 10/24/2018    1    Objective:   Vitals:   10/25/18 2000 10/25/18 2013 10/26/18 0520 10/26/18 0740  BP:  (!) 146/93  131/79  Pulse: (!) 104   68  Resp: (!) 32   (!) 30  Temp:  99.1 F (37.3 C) 98.7 F (37.1 C) 98.5 F (36.9 C)  TempSrc:  Oral Oral Oral  SpO2: 91%   93%  Weight:      Height:        Wt Readings from Last 3 Encounters:  10/24/18 83.9 kg     Intake/Output Summary (Last 24 hours) at 10/26/2018 1125 Last data filed at 10/25/2018 1620 Gross per 24 hour  Intake 720 ml  Output 300 ml  Net 420 ml     Physical Exam Gen Exam:Alert awake-not in any distress HEENT:atraumatic, normocephalic Chest: B/L clear to auscultation anteriorly CVS:S1S2 regular Abdomen:soft non tender, non distended Extremities:no edema Neurology: Non focal Skin: no rash   Data Review:    CBC Recent Labs  Lab 10/25/18 0144 10/26/18 0452   WBC 10.2 14.5*  HGB 15.4 14.3  HCT 46.4 43.5  PLT 127* 172  MCV 88.4 89.3  MCH 29.3 29.4  MCHC 33.2 32.9  RDW 12.1 12.1  LYMPHSABS 1.7  --   MONOABS 0.5  --   EOSABS 0.0  --   BASOSABS 0.0  --     Chemistries  Recent Labs  Lab 10/25/18 0144 10/26/18 0452  NA 135 139  K 4.2 4.2  CL 100 104  CO2 24 24  GLUCOSE 138* 206*  BUN 8 18  CREATININE 1.18 0.82  CALCIUM 8.2* 8.6*  AST 40 28  ALT 50* 39  ALKPHOS 58 54  BILITOT 0.8 0.7   ------------------------------------------------------------------------------------------------------------------ Recent Labs    10/25/18 0144  TRIG 80    No results found for: HGBA1C ------------------------------------------------------------------------------------------------------------------ No results for input(s): TSH, T4TOTAL, T3FREE, THYROIDAB in the last 72 hours.  Invalid input(s): FREET3 ------------------------------------------------------------------------------------------------------------------ Recent Labs    10/25/18 0144 10/26/18 0452  FERRITIN 757* 854*    Coagulation profile No results for input(s): INR, PROTIME in the last 168 hours.  Recent Labs    10/25/18 0144 10/26/18 0452  DDIMER 1.09* 0.95*    Cardiac Enzymes No results for input(s): CKMB, TROPONINI, MYOGLOBIN in the last 168 hours.  Invalid input(s): CK ------------------------------------------------------------------------------------------------------------------    Component Value Date/Time   BNP 24.9 10/25/2018 0610    Micro Results Recent Results (from the past 240 hour(s))  Blood Culture (routine x 2)     Status:  None (Preliminary result)   Collection Time: 10/25/18  1:35 AM   Specimen: BLOOD RIGHT ARM  Result Value Ref Range Status   Specimen Description BLOOD RIGHT ARM  Final   Special Requests   Final    BOTTLES DRAWN AEROBIC AND ANAEROBIC Blood Culture adequate volume   Culture   Final    NO GROWTH 1 DAY Performed at  [facility], [address] [place], [place] [address]    Report Status PENDING  Incomplete  Blood Culture (routine x 2)     Status: None (Preliminary result)   Collection Time: 10/25/18  1:47 AM   Specimen: BLOOD RIGHT HAND  Result Value Ref Range Status   Specimen Description BLOOD RIGHT HAND  Final   Special Requests   Final    AEROBIC BOTTLE ONLY Blood Culture results may not be optimal due to an excessive volume of blood received in culture bottles   Culture   Final    NO GROWTH 1 DAY Performed at [facility], [address] [place], [place] [address]    Report Status PENDING  Incomplete    Radiology Reports Dg Chest Portable 1 View  Result Date: 10/25/2018 CLINICAL DATA:  Shortness of breath EXAM: PORTABLE CHEST 1 VIEW COMPARISON:  None. FINDINGS: There are multifocal airspace opacities bilaterally. The heart size is normal. There is no pneumothorax. No large pleural effusion. No acute osseous abnormality. No large pleural effusion. IMPRESSION: Multifocal airspace opacities concerning for multifocal pneumonia (viral or bacterial). Electronically Signed   By: Katherine Mantlehristopher  Green M.D.   On: 10/25/2018 00:31

## 2018-10-26 NOTE — Plan of Care (Signed)
  Problem: Education: Goal: Knowledge of General Education information will improve Description: Including pain rating scale, medication(s)/side effects and non-pharmacologic comfort measures Outcome: Progressing   Problem: Health Behavior/Discharge Planning: Goal: Ability to manage health-related needs will improve Outcome: Progressing   Problem: Clinical Measurements: Goal: Ability to maintain clinical measurements within normal limits will improve Outcome: Progressing Goal: Respiratory complications will improve Outcome: Progressing Goal: Cardiovascular complication will be avoided Outcome: Progressing   Problem: Activity: Goal: Risk for activity intolerance will decrease Outcome: Progressing   Problem: Nutrition: Goal: Adequate nutrition will be maintained Outcome: Progressing   Problem: Coping: Goal: Level of anxiety will decrease Outcome: Progressing   Problem: Elimination: Goal: Will not experience complications related to bowel motility Outcome: Progressing Goal: Will not experience complications related to urinary retention Outcome: Progressing   Problem: Pain Managment: Goal: General experience of comfort will improve Outcome: Progressing   Problem: Safety: Goal: Ability to remain free from injury will improve Outcome: Progressing   Problem: Skin Integrity: Goal: Risk for impaired skin integrity will decrease Outcome: Progressing   Problem: Education: Goal: Knowledge of risk factors and measures for prevention of condition will improve Outcome: Progressing   Problem: Coping: Goal: Psychosocial and spiritual needs will be supported Outcome: Progressing   Problem: Respiratory: Goal: Will maintain a patent airway Outcome: Progressing Goal: Complications related to the disease process, condition or treatment will be avoided or minimized Outcome: Progressing   Problem: Clinical Measurements: Goal: Diagnostic test results will improve Outcome: Not  Progressing Note: Labs remain abnormal

## 2018-10-26 NOTE — Progress Notes (Signed)
0743  Resting in bed with eyes closed.  Awakens to voice.  O2 sats 92% on room air.  Dry cough noted.  States mucinex did help some.

## 2018-10-27 LAB — CBC
HCT: 43.1 % (ref 39.0–52.0)
Hemoglobin: 14 g/dL (ref 13.0–17.0)
MCH: 29 pg (ref 26.0–34.0)
MCHC: 32.5 g/dL (ref 30.0–36.0)
MCV: 89.2 fL (ref 80.0–100.0)
Platelets: 230 10*3/uL (ref 150–400)
RBC: 4.83 MIL/uL (ref 4.22–5.81)
RDW: 12 % (ref 11.5–15.5)
WBC: 19.5 10*3/uL — ABNORMAL HIGH (ref 4.0–10.5)
nRBC: 0 % (ref 0.0–0.2)

## 2018-10-27 LAB — COMPREHENSIVE METABOLIC PANEL
ALT: 39 U/L (ref 0–44)
AST: 28 U/L (ref 15–41)
Albumin: 3.2 g/dL — ABNORMAL LOW (ref 3.5–5.0)
Alkaline Phosphatase: 63 U/L (ref 38–126)
Anion gap: 12 (ref 5–15)
BUN: 23 mg/dL — ABNORMAL HIGH (ref 6–20)
CO2: 24 mmol/L (ref 22–32)
Calcium: 8.5 mg/dL — ABNORMAL LOW (ref 8.9–10.3)
Chloride: 102 mmol/L (ref 98–111)
Creatinine, Ser: 0.87 mg/dL (ref 0.61–1.24)
GFR calc Af Amer: >60 mL/min (ref >60)
GFR calc non Af Amer: >60 mL/min (ref >60)
Glucose, Bld: 209 mg/dL — ABNORMAL HIGH (ref 70–99)
Potassium: 4.5 mmol/L (ref 3.5–5.1)
Sodium: 138 mmol/L (ref 135–145)
Total Bilirubin: 0.4 mg/dL (ref 0.3–1.2)
Total Protein: 7.1 g/dL (ref 6.5–8.1)

## 2018-10-27 LAB — C-REACTIVE PROTEIN: CRP: 9.3 mg/dL — ABNORMAL HIGH (ref <1.0)

## 2018-10-27 LAB — FERRITIN: Ferritin: 842 ng/mL — ABNORMAL HIGH (ref 24–336)

## 2018-10-27 LAB — HEPATITIS B SURFACE ANTIGEN: Hepatitis B Surface Ag: NEGATIVE

## 2018-10-27 LAB — D-DIMER, QUANTITATIVE: D-Dimer, Quant: 0.8 ug/mL-FEU — ABNORMAL HIGH (ref 0.00–0.50)

## 2018-10-27 MED ORDER — SODIUM CHLORIDE 0.9 % IV SOLN
100.0000 mg | INTRAVENOUS | Status: AC
  Administered 2018-10-28 – 2018-10-31 (×4): 100 mg via INTRAVENOUS
  Filled 2018-10-27 (×4): qty 20

## 2018-10-27 MED ORDER — BENZONATATE 100 MG PO CAPS
200.0000 mg | ORAL_CAPSULE | Freq: Three times a day (TID) | ORAL | Status: DC
  Administered 2018-10-27 – 2018-10-31 (×12): 200 mg via ORAL
  Filled 2018-10-27 (×12): qty 2

## 2018-10-27 MED ORDER — SODIUM CHLORIDE 0.9 % IV SOLN
200.0000 mg | Freq: Once | INTRAVENOUS | Status: AC
  Administered 2018-10-27: 12:00:00 200 mg via INTRAVENOUS
  Filled 2018-10-27: qty 40

## 2018-10-27 MED ORDER — HYDROCOD POLST-CPM POLST ER 10-8 MG/5ML PO SUER
5.0000 mL | Freq: Two times a day (BID) | ORAL | Status: DC | PRN
  Administered 2018-10-27 – 2018-10-28 (×2): 5 mL via ORAL
  Filled 2018-10-27 (×2): qty 5

## 2018-10-27 NOTE — Progress Notes (Signed)
PROGRESS NOTE                                                                                                                                                                                                             Patient Demographics:    Scott Nicholson, is a 43 y.o. male, DOB - May 12, 1975, JKD:[id number]  Outpatient Primary MD for the patient is Patient, No Pcp Per    LOS - 2  Chief Complaint  Patient presents with  . Shortness of Breath  . Cough       Brief Narrative: Patient is a 43 y.o. male with no past medical history-presented with fever, cough-found to have COVID-19 viral pneumonia.   Subjective:   Continues to have coughing spells-developed shortness of breath when walking to the bathroom.  Down to 87% on room air-now requiring 2 L of oxygen to maintain O2 saturation.   Assessment  & Plan :   Acute hypoxic respiratory failure secondary to COVID-19 pneumonia:  Worsening hypoxia-on steroids since admission-since has hypoxia and appears to be more short of breath with ambulation-we will go and start Remdesivir.  Inflammatory markers are stable-CRP is slowly downtrending.  Need to monitor closely.  COVID-19 Labs:  Recent Labs    10/25/18 0144 10/26/18 0452 10/27/18 0215  DDIMER 1.09* 0.95* 0.80*  FERRITIN 757* 854* 842*  LDH 234*  --   --   CRP 16.5* 18.1* 9.3*    No results found for: SARSCOV2NAA   COVID-19 Medications: 6/27>> Solu-Medrol 6/29>> Remdesivir   ABG: No results found for: PHART, PCO2ART, PO2ART, HCO3, TCO2, ACIDBASEDEF, O2SAT  Condition -Stable  Family Communication  :  None at bedside  Code Status :  Full Code  Diet :  Diet Order            Diet regular Room service appropriate? Yes; Fluid consistency: Thin  Diet effective now               Disposition Plan  :  Remain inpatient  DVT Prophylaxis  :  Lovenox   Lab Results  Component Value Date   PLT 230  10/27/2018    Inpatient Medications  Scheduled Meds: . cholecalciferol  1,000 Units Oral Daily  . enoxaparin (LOVENOX) injection  40 mg Subcutaneous Q24H  . ipratropium  2 puff Inhalation TID  . levalbuterol  2 puff Inhalation Q8H  . methylPREDNISolone (SOLU-MEDROL) injection  60 mg Intravenous Q12H  . vitamin C  1,000 mg Oral Daily  . zinc sulfate  220 mg Oral Daily   Continuous Infusions: . [START ON 10/28/2018] remdesivir 100 mg in NS 250 mL     PRN Meds:.acetaminophen, dextromethorphan-guaiFENesin, ondansetron **OR** ondansetron (ZOFRAN) IV  Antibiotics  :    Anti-infectives (From admission, onward)   Start     Dose/Rate Route Frequency Ordered Stop   10/28/18 1000  remdesivir 100 mg in sodium chloride 0.9 % 250 mL IVPB     100 mg 500 mL/hr over 30 Minutes Intravenous Every 24 hours 10/27/18 0942 11/01/18 0959   10/27/18 1000  remdesivir 200 mg in sodium chloride 0.9 % 250 mL IVPB     200 mg 500 mL/hr over 30 Minutes Intravenous Once 10/27/18 0942 10/27/18 1205   10/25/18 0500  azithromycin (ZITHROMAX) 500 mg in sodium chloride 0.9 % 250 mL IVPB  Status:  Discontinued     500 mg 250 mL/hr over 60 Minutes Intravenous Every 24 hours 10/25/18 0359 10/25/18 1059   10/25/18 0400  cefTRIAXone (ROCEPHIN) 1 g in sodium chloride 0.9 % 100 mL IVPB  Status:  Discontinued     1 g 200 mL/hr over 30 Minutes Intravenous Every 24 hours 10/25/18 0359 10/25/18 1059       Time Spent in minutes  25   Oren Binet M.D on 10/27/2018 at 1:14 PM  To page go to [email or web address] - use universal password  [facility] -  Office  [phone]  See all Orders from today for further details   Admit date - 10/24/2018    2    Objective:   Vitals:   10/26/18 2004 10/27/18 0352 10/27/18 0901 10/27/18 0905  BP: (!) 141/83 (!) 142/83 (!) 151/83 (!) 145/104  Pulse: 97 81    Resp: (!) 35 (!) 29    Temp: 98.7 F (37.1 C) 98.3 F (36.8 C) 98.6 F (37 C) 97.6 F (36.4 C)  TempSrc:  Oral Oral Axillary Axillary  SpO2: 93% 95%    Weight:      Height:        Wt Readings from Last 3 Encounters:  10/24/18 83.9 kg     Intake/Output Summary (Last 24 hours) at 10/27/2018 1314 Last data filed at 10/27/2018 0900 Gross per 24 hour  Intake 240 ml  Output -  Net 240 ml     Physical Exam General appearance:Awake, alert, not in any distress.  Eyes:no scleral icterus. HEENT: Atraumatic and Normocephalic Neck: supple, no JVD. Resp:Good air entry bilaterally,no rales or rhonchi CVS: S1 S2 regular, no murmurs.  GI: Bowel sounds present, Non tender and not distended with no gaurding, rigidity or rebound. Extremities: B/L Lower Ext shows no edema, both legs are warm to touch Neurology:  Non focal Psychiatric: Normal judgment and insight. Normal mood. Musculoskeletal:No digital cyanosis Skin:No Rash, warm and dry Wounds:N/A  Data Review:    CBC Recent Labs  Lab 10/25/18 0144 10/26/18 0452 10/27/18 0215  WBC 10.2 14.5* 19.5*  HGB 15.4 14.3 14.0  HCT 46.4 43.5 43.1  PLT 127* 172 230  MCV 88.4 89.3 89.2  MCH 29.3 29.4 29.0  MCHC 33.2 32.9 32.5  RDW 12.1 12.1 12.0  LYMPHSABS 1.7  --   --   MONOABS 0.5  --   --   EOSABS 0.0  --   --   BASOSABS 0.0  --   --  Chemistries  Recent Labs  Lab 10/25/18 0144 10/26/18 0452 10/27/18 0215  NA 135 139 138  K 4.2 4.2 4.5  CL 100 104 102  CO2 24 24 24   GLUCOSE 138* 206* 209*  BUN 8 18 23*  CREATININE 1.18 0.82 0.87  CALCIUM 8.2* 8.6* 8.5*  AST 40 28 28  ALT 50* 39 39  ALKPHOS 58 54 63  BILITOT 0.8 0.7 0.4   ------------------------------------------------------------------------------------------------------------------ Recent Labs    10/25/18 0144  TRIG 80    No results found for: HGBA1C ------------------------------------------------------------------------------------------------------------------ No results for input(s): TSH, T4TOTAL, T3FREE, THYROIDAB in the last 72 hours.  Invalid input(s):  FREET3 ------------------------------------------------------------------------------------------------------------------ Recent Labs    10/26/18 0452 10/27/18 0215  FERRITIN 854* 842*    Coagulation profile No results for input(s): INR, PROTIME in the last 168 hours.  Recent Labs    10/26/18 0452 10/27/18 0215  DDIMER 0.95* 0.80*    Cardiac Enzymes No results for input(s): CKMB, TROPONINI, MYOGLOBIN in the last 168 hours.  Invalid input(s): CK ------------------------------------------------------------------------------------------------------------------    Component Value Date/Time   BNP 24.9 10/25/2018 0610    Micro Results Recent Results (from the past 240 hour(s))  Blood Culture (routine x 2)     Status: None (Preliminary result)   Collection Time: 10/25/18  1:35 AM   Specimen: BLOOD RIGHT ARM  Result Value Ref Range Status   Specimen Description BLOOD RIGHT ARM  Final   Special Requests   Final    BOTTLES DRAWN AEROBIC AND ANAEROBIC Blood Culture adequate volume   Culture   Final    NO GROWTH 2 DAYS Performed at  [facility], [address] [place], [place] [address]    Report Status PENDING  Incomplete  Blood Culture (routine x 2)     Status: None (Preliminary result)   Collection Time: 10/25/18  1:47 AM   Specimen: BLOOD RIGHT HAND  Result Value Ref Range Status   Specimen Description BLOOD RIGHT HAND  Final   Special Requests   Final    AEROBIC BOTTLE ONLY Blood Culture results may not be optimal due to an excessive volume of blood received in culture bottles   Culture   Final    NO GROWTH 2 DAYS Performed at [facility], [address] [place], [place] [address]    Report Status PENDING  Incomplete    Radiology Reports Dg Chest Portable 1 View  Result Date: 10/25/2018 CLINICAL DATA:  Shortness of breath EXAM: PORTABLE CHEST 1 VIEW COMPARISON:  None. FINDINGS: There are multifocal airspace opacities bilaterally. The heart size is  normal. There is no pneumothorax. No large pleural effusion. No acute osseous abnormality. No large pleural effusion. IMPRESSION: Multifocal airspace opacities concerning for multifocal pneumonia (viral or bacterial). Electronically Signed   By: Katherine Mantlehristopher  Green M.D.   On: 10/25/2018 00:31

## 2018-10-27 NOTE — Progress Notes (Signed)
Family update: wife is a patient here so she visited his room and was updated.

## 2018-10-27 NOTE — Progress Notes (Signed)
Mersedes (spouse) contacted with update on pt.

## 2018-10-27 NOTE — Plan of Care (Signed)
Pt progressing toward all care plan goals

## 2018-10-27 NOTE — Progress Notes (Signed)
Seen and examined-claims SOB now noted on minimal ambulation 0 2 sat  88% on RA requiriing 02 supplementation this morning Meets criteria for remdesivir Full note to follow

## 2018-10-27 NOTE — Progress Notes (Signed)
Pharmacy Brief Note   O:  ALT: 39 CXR: Multifocal airspace opacities concerning for multifocal pneumonia SpO2: 91% on 2L [name]   A/P:  Patient meets criteria for remdesivir. Will initiate remdesivir 200 mg once followed by 100 mg daily x 4 days.   Gretta Arab PharmD, BCPS Clinical pharmacist phone 7am- 5pm: [phone] 10/27/2018 9:40 AM

## 2018-10-28 LAB — CBC
HCT: 44.3 % (ref 39.0–52.0)
Hemoglobin: 14.5 g/dL (ref 13.0–17.0)
MCH: 29.2 pg (ref 26.0–34.0)
MCHC: 32.7 g/dL (ref 30.0–36.0)
MCV: 89.3 fL (ref 80.0–100.0)
Platelets: 256 10*3/uL (ref 150–400)
RBC: 4.96 MIL/uL (ref 4.22–5.81)
RDW: 11.9 % (ref 11.5–15.5)
WBC: 15.9 10*3/uL — ABNORMAL HIGH (ref 4.0–10.5)
nRBC: 0 % (ref 0.0–0.2)

## 2018-10-28 LAB — COMPREHENSIVE METABOLIC PANEL
ALT: 46 U/L — ABNORMAL HIGH (ref 0–44)
AST: 29 U/L (ref 15–41)
Albumin: 3.3 g/dL — ABNORMAL LOW (ref 3.5–5.0)
Alkaline Phosphatase: 69 U/L (ref 38–126)
Anion gap: 12 (ref 5–15)
BUN: 29 mg/dL — ABNORMAL HIGH (ref 6–20)
CO2: 25 mmol/L (ref 22–32)
Calcium: 8.4 mg/dL — ABNORMAL LOW (ref 8.9–10.3)
Chloride: 102 mmol/L (ref 98–111)
Creatinine, Ser: 0.88 mg/dL (ref 0.61–1.24)
GFR calc Af Amer: >60 mL/min (ref >60)
GFR calc non Af Amer: >60 mL/min (ref >60)
Glucose, Bld: 226 mg/dL — ABNORMAL HIGH (ref 70–99)
Potassium: 4.5 mmol/L (ref 3.5–5.1)
Sodium: 139 mmol/L (ref 135–145)
Total Bilirubin: 0.8 mg/dL (ref 0.3–1.2)
Total Protein: 7.3 g/dL (ref 6.5–8.1)

## 2018-10-28 LAB — FERRITIN: Ferritin: 835 ng/mL — ABNORMAL HIGH (ref 24–336)

## 2018-10-28 LAB — C-REACTIVE PROTEIN: CRP: 4.8 mg/dL — ABNORMAL HIGH (ref <1.0)

## 2018-10-28 LAB — D-DIMER, QUANTITATIVE: D-Dimer, Quant: 0.84 ug/mL-FEU — ABNORMAL HIGH (ref 0.00–0.50)

## 2018-10-28 NOTE — Progress Notes (Signed)
Family update:Spoke with patients wife on facetime on his phone and updated her. I answered her questions.

## 2018-10-28 NOTE — Progress Notes (Signed)
Inpatient Diabetes Program Recommendations  AACE/ADA: New Consensus Statement on Inpatient Glycemic Control (2015)  Target Ranges:  Prepandial:   less than 140 mg/dL      Peak postprandial:   less than 180 mg/dL (1-2 hours)      Critically ill patients:  140 - 180 mg/dL   No results found for: GLUCAP, HGBA1C  Review of Glycemic Control Results for Scott Nicholson, Scott Nicholson (MRN [id number]) as of 10/28/2018 14:03  Ref. Range 10/26/2018 04:52 10/27/2018 02:15 10/28/2018 04:10  Glucose Latest Ref Range: 70 - 99 mg/dL 206 (H) 209 (H) 226 (H)   Diabetes history: None Outpatient Diabetes medications:  None Current orders for Inpatient glycemic control:  Solumedrol 60 mg IV q 12 hours Inpatient Diabetes Program Recommendations:   Note that lab glucose>200 mg/dL. Consider adding Novolog moderate correction tid with meals and HS.  Also consider adding A1C.   Thanks,  Adah Perl, RN, BC-ADM Inpatient Diabetes Coordinator Pager [phone] (8a-5p)

## 2018-10-28 NOTE — Progress Notes (Signed)
PROGRESS NOTE                                                                                                                                                                                                             Patient Demographics:    Scott Nicholson, is a 43 y.o. male, DOB - May 06, 1975, ZOX:[id number]  Outpatient Primary MD for the patient is Patient, No Pcp Per    LOS - 3  Chief Complaint  Patient presents with  . Shortness of Breath  . Cough       Brief Narrative: Patient is a 43 y.o. male with no past medical history-presented with fever, cough-found to have COVID-19 viral pneumonia.   Subjective:   Looks comfortable-but still hypoxic with O2 sats of around 87% on room air-with 2 L-O2 saturation in the mid 90s.   Assessment  & Plan :   Acute hypoxic respiratory failure secondary to COVID-19 pneumonia:  Relatively stable-essentially unchanged compared to yesterday-continue steroids and Remdesivir.  Inflammatory markers are slowly downtrending-continue to slowly titrate down oxygen support.  Given relative clinical stability-no indication to escalate care with Actemra and or convalescent plasma.    COVID-19 Labs:  Recent Labs    10/26/18 0452 10/27/18 0215 10/28/18 0410  DDIMER 0.95* 0.80* 0.84*  FERRITIN 854* 842* 835*  CRP 18.1* 9.3* 4.8*    No results found for: SARSCOV2NAA   COVID-19 Medications: 6/27>> Solu-Medrol 6/29>> Remdesivir   ABG: No results found for: PHART, PCO2ART, PO2ART, HCO3, TCO2, ACIDBASEDEF, O2SAT  Condition -Stable  Family Communication  :  None at bedside  Code Status :  Full Code  Diet :  Diet Order            Diet regular Room service appropriate? Yes; Fluid consistency: Thin  Diet effective now               Disposition Plan  :  Remain inpatient  DVT Prophylaxis  :  Lovenox   Lab Results  Component Value Date   PLT 256 10/28/2018    Inpatient  Medications  Scheduled Meds: . benzonatate  200 mg Oral TID  . cholecalciferol  1,000 Units Oral Daily  . enoxaparin (LOVENOX) injection  40 mg Subcutaneous Q24H  . ipratropium  2 puff Inhalation TID  . levalbuterol  2 puff Inhalation Q8H  . methylPREDNISolone (  SOLU-MEDROL) injection  60 mg Intravenous Q12H  . vitamin C  1,000 mg Oral Daily  . zinc sulfate  220 mg Oral Daily   Continuous Infusions: . remdesivir 100 mg in NS 250 mL 100 mg (10/28/18 1117)   PRN Meds:.acetaminophen, chlorpheniramine-HYDROcodone, dextromethorphan-guaiFENesin, ondansetron **OR** ondansetron (ZOFRAN) IV  Antibiotics  :    Anti-infectives (From admission, onward)   Start     Dose/Rate Route Frequency Ordered Stop   10/28/18 1000  remdesivir 100 mg in sodium chloride 0.9 % 250 mL IVPB     100 mg 500 mL/hr over 30 Minutes Intravenous Every 24 hours 10/27/18 0942 11/01/18 0959   10/27/18 1000  remdesivir 200 mg in sodium chloride 0.9 % 250 mL IVPB     200 mg 500 mL/hr over 30 Minutes Intravenous Once 10/27/18 0942 10/27/18 1205   10/25/18 0500  azithromycin (ZITHROMAX) 500 mg in sodium chloride 0.9 % 250 mL IVPB  Status:  Discontinued     500 mg 250 mL/hr over 60 Minutes Intravenous Every 24 hours 10/25/18 0359 10/25/18 1059   10/25/18 0400  cefTRIAXone (ROCEPHIN) 1 g in sodium chloride 0.9 % 100 mL IVPB  Status:  Discontinued     1 g 200 mL/hr over 30 Minutes Intravenous Every 24 hours 10/25/18 0359 10/25/18 1059       Time Spent in minutes  25   Jeoffrey MassedShanker  M.D on 10/28/2018 at 1:20 PM  To page go to [email or web address] - use universal password  [facility] -  Office  [phone]  See all Orders from today for further details   Admit date - 10/24/2018    3    Objective:   Vitals:   10/27/18 2002 10/28/18 0525 10/28/18 0527 10/28/18 0812  BP: (!) 146/88  130/86 134/82  Pulse: 74 (!) 54 (!) 56   Resp:  (!) 24 (!) 26   Temp: 98.3 F (36.8 C) 98.1 F (36.7 C)  98 F (36.7 C)   TempSrc: Oral Oral  Oral  SpO2: 90% 96% 95%   Weight:      Height:        Wt Readings from Last 3 Encounters:  10/24/18 83.9 kg     Intake/Output Summary (Last 24 hours) at 10/28/2018 1320 Last data filed at 10/28/2018 0813 Gross per 24 hour  Intake 1120 ml  Output 1 ml  Net 1119 ml     Physical Exam General appearance:Awake, alert, not in any distress.  Eyes:no scleral icterus. HEENT: Atraumatic and Normocephalic Neck: supple, no JVD. Resp:Good air entry bilaterally,no rales or rhonchi CVS: S1 S2 regular, no murmurs.  GI: Bowel sounds present, Non tender and not distended with no gaurding, rigidity or rebound. Extremities: B/L Lower Ext shows no edema, both legs are warm to touch Neurology:  Non focal Psychiatric: Normal judgment and insight. Normal mood. Musculoskeletal:No digital cyanosis Skin:No Rash, warm and dry Wounds:N/A   Data Review:    CBC Recent Labs  Lab 10/25/18 0144 10/26/18 0452 10/27/18 0215 10/28/18 0410  WBC 10.2 14.5* 19.5* 15.9*  HGB 15.4 14.3 14.0 14.5  HCT 46.4 43.5 43.1 44.3  PLT 127* 172 230 256  MCV 88.4 89.3 89.2 89.3  MCH 29.3 29.4 29.0 29.2  MCHC 33.2 32.9 32.5 32.7  RDW 12.1 12.1 12.0 11.9  LYMPHSABS 1.7  --   --   --   MONOABS 0.5  --   --   --   EOSABS 0.0  --   --   --  BASOSABS 0.0  --   --   --     Chemistries  Recent Labs  Lab 10/25/18 0144 10/26/18 0452 10/27/18 0215 10/28/18 0410  NA 135 139 138 139  K 4.2 4.2 4.5 4.5  CL 100 104 102 102  CO2 24 24 24 25   GLUCOSE 138* 206* 209* 226*  BUN 8 18 23* 29*  CREATININE 1.18 0.82 0.87 0.88  CALCIUM 8.2* 8.6* 8.5* 8.4*  AST 40 28 28 29   ALT 50* 39 39 46*  ALKPHOS 58 54 63 69  BILITOT 0.8 0.7 0.4 0.8   ------------------------------------------------------------------------------------------------------------------ No results for input(s): CHOL, HDL, LDLCALC, TRIG, CHOLHDL, LDLDIRECT in the last 72 hours.  No results found for: HGBA1C  ------------------------------------------------------------------------------------------------------------------ No results for input(s): TSH, T4TOTAL, T3FREE, THYROIDAB in the last 72 hours.  Invalid input(s): FREET3 ------------------------------------------------------------------------------------------------------------------ Recent Labs    10/27/18 0215 10/28/18 0410  FERRITIN 842* 835*    Coagulation profile No results for input(s): INR, PROTIME in the last 168 hours.  Recent Labs    10/27/18 0215 10/28/18 0410  DDIMER 0.80* 0.84*    Cardiac Enzymes No results for input(s): CKMB, TROPONINI, MYOGLOBIN in the last 168 hours.  Invalid input(s): CK ------------------------------------------------------------------------------------------------------------------    Component Value Date/Time   BNP 24.9 10/25/2018 0610    Micro Results Recent Results (from the past 240 hour(s))  Blood Culture (routine x 2)     Status: None (Preliminary result)   Collection Time: 10/25/18  1:35 AM   Specimen: BLOOD RIGHT ARM  Result Value Ref Range Status   Specimen Description BLOOD RIGHT ARM  Final   Special Requests   Final    BOTTLES DRAWN AEROBIC AND ANAEROBIC Blood Culture adequate volume   Culture   Final    NO GROWTH 3 DAYS Performed at [facility], [address], [place], [place] [address]    Report Status PENDING  Incomplete  Blood Culture (routine x 2)     Status: None (Preliminary result)   Collection Time: 10/25/18  1:47 AM   Specimen: BLOOD RIGHT HAND  Result Value Ref Range Status   Specimen Description BLOOD RIGHT HAND  Final   Special Requests   Final    AEROBIC BOTTLE ONLY Blood Culture results may not be optimal due to an excessive volume of blood received in culture bottles   Culture   Final    NO GROWTH 3 DAYS Performed at [facility], [place] [address], [place], [place] [address]    Report Status PENDING  Incomplete    Radiology Reports  Dg Chest Portable 1 View  Result Date: 10/25/2018 CLINICAL DATA:  Shortness of breath EXAM: PORTABLE CHEST 1 VIEW COMPARISON:  None. FINDINGS: There are multifocal airspace opacities bilaterally. The heart size is normal. There is no pneumothorax. No large pleural effusion. No acute osseous abnormality. No large pleural effusion. IMPRESSION: Multifocal airspace opacities concerning for multifocal pneumonia (viral or bacterial). Electronically Signed   By: Constance Holster M.D.   On: 10/25/2018 00:31

## 2018-10-29 LAB — GLUCOSE, CAPILLARY
Glucose-Capillary: 242 mg/dL — ABNORMAL HIGH (ref 70–99)
Glucose-Capillary: 367 mg/dL — ABNORMAL HIGH (ref 70–99)
Glucose-Capillary: 327 mg/dL — ABNORMAL HIGH (ref 70–99)
Glucose-Capillary: 342 mg/dL — ABNORMAL HIGH (ref 70–99)

## 2018-10-29 LAB — CBC
HCT: 43.5 % (ref 39.0–52.0)
Hemoglobin: 14.3 g/dL (ref 13.0–17.0)
MCH: 28.9 pg (ref 26.0–34.0)
MCHC: 32.9 g/dL (ref 30.0–36.0)
MCV: 88.1 fL (ref 80.0–100.0)
Platelets: 277 10*3/uL (ref 150–400)
RBC: 4.94 MIL/uL (ref 4.22–5.81)
RDW: 11.9 % (ref 11.5–15.5)
WBC: 12.6 10*3/uL — ABNORMAL HIGH (ref 4.0–10.5)
nRBC: 0.2 % (ref 0.0–0.2)

## 2018-10-29 LAB — D-DIMER, QUANTITATIVE: D-Dimer, Quant: 0.81 ug/mL-FEU — ABNORMAL HIGH (ref 0.00–0.50)

## 2018-10-29 LAB — COMPREHENSIVE METABOLIC PANEL
ALT: 49 U/L — ABNORMAL HIGH (ref 0–44)
AST: 25 U/L (ref 15–41)
Albumin: 3 g/dL — ABNORMAL LOW (ref 3.5–5.0)
Alkaline Phosphatase: 60 U/L (ref 38–126)
Anion gap: 10 (ref 5–15)
BUN: 27 mg/dL — ABNORMAL HIGH (ref 6–20)
CO2: 24 mmol/L (ref 22–32)
Calcium: 8.4 mg/dL — ABNORMAL LOW (ref 8.9–10.3)
Chloride: 103 mmol/L (ref 98–111)
Creatinine, Ser: 0.82 mg/dL (ref 0.61–1.24)
GFR calc Af Amer: >60 mL/min (ref >60)
GFR calc non Af Amer: >60 mL/min (ref >60)
Glucose, Bld: 290 mg/dL — ABNORMAL HIGH (ref 70–99)
Potassium: 4.5 mmol/L (ref 3.5–5.1)
Sodium: 137 mmol/L (ref 135–145)
Total Bilirubin: 0.5 mg/dL (ref 0.3–1.2)
Total Protein: 6.7 g/dL (ref 6.5–8.1)

## 2018-10-29 LAB — HEMOGLOBIN A1C
Hgb A1c MFr Bld: 6.7 % — ABNORMAL HIGH (ref 4.8–5.6)
Mean Plasma Glucose: 145.59 mg/dL

## 2018-10-29 LAB — FERRITIN: Ferritin: 717 ng/mL — ABNORMAL HIGH (ref 24–336)

## 2018-10-29 LAB — C-REACTIVE PROTEIN: CRP: 2.2 mg/dL — ABNORMAL HIGH (ref <1.0)

## 2018-10-29 MED ORDER — LIVING WELL WITH DIABETES BOOK - IN SPANISH
Freq: Once | Status: AC
  Administered 2018-10-29: 1
  Filled 2018-10-29: qty 1

## 2018-10-29 MED ORDER — INSULIN DETEMIR 100 UNIT/ML ~~LOC~~ SOLN
8.0000 [IU] | Freq: Two times a day (BID) | SUBCUTANEOUS | Status: DC
  Administered 2018-10-29 (×2): 8 [IU] via SUBCUTANEOUS
  Filled 2018-10-29 (×3): qty 0.08

## 2018-10-29 MED ORDER — INSULIN ASPART 100 UNIT/ML ~~LOC~~ SOLN
0.0000 [IU] | Freq: Every day | SUBCUTANEOUS | Status: DC
  Administered 2018-10-29 – 2018-10-30 (×2): 4 [IU] via SUBCUTANEOUS

## 2018-10-29 MED ORDER — INSULIN ASPART 100 UNIT/ML ~~LOC~~ SOLN
0.0000 [IU] | Freq: Three times a day (TID) | SUBCUTANEOUS | Status: DC
  Administered 2018-10-30: 11 [IU] via SUBCUTANEOUS
  Administered 2018-10-30: 5 [IU] via SUBCUTANEOUS
  Administered 2018-10-30: 15 [IU] via SUBCUTANEOUS
  Administered 2018-10-31: 11 [IU] via SUBCUTANEOUS
  Administered 2018-10-31: 08:00:00 3 [IU] via SUBCUTANEOUS

## 2018-10-29 MED ORDER — INSULIN ASPART 100 UNIT/ML ~~LOC~~ SOLN
0.0000 [IU] | Freq: Three times a day (TID) | SUBCUTANEOUS | Status: DC
  Administered 2018-10-29: 9 [IU] via SUBCUTANEOUS
  Administered 2018-10-29: 3 [IU] via SUBCUTANEOUS
  Administered 2018-10-29: 7 [IU] via SUBCUTANEOUS

## 2018-10-29 NOTE — Plan of Care (Signed)
Pt progressing

## 2018-10-29 NOTE — Progress Notes (Addendum)
Talked to patient's wife to give her an update. She appreciated the call and the care we are giving her husband.

## 2018-10-29 NOTE — Progress Notes (Addendum)
CBG 342 with no HS coverage. Paged Dr Loleta Books. Ne order for HS insulin coverage. Gave patient 4 units novolog SQ. Will continue to monitor.

## 2018-10-29 NOTE — Progress Notes (Signed)
Inpatient Diabetes Program Recommendations  AACE/ADA: New Consensus Statement on Inpatient Glycemic Control (2015)  Target Ranges:  Prepandial:   less than 140 mg/dL      Peak postprandial:   less than 180 mg/dL (1-2 hours)      Critically ill patients:  140 - 180 mg/dL   Lab Results  Component Value Date   GLUCAP 367 (H) 10/29/2018   HGBA1C 6.7 (H) 10/29/2018    Review of Glycemic Control Results for Scott Nicholson, Scott Nicholson (MRN [id number]) as of 10/29/2018 14:22  Ref. Range 10/29/2018 07:30 10/29/2018 11:06  Glucose-Capillary Latest Ref Range: 70 - 99 mg/dL 242 (H) 367 (H)   Diabetes history: None noted Outpatient Diabetes medications:  None Current orders for Inpatient glycemic control:  Novolog sensitive tid with meals   Inpatient Diabetes Program Recommendations:    May consider adding Levemir 12 units bid due to increased blood sugars with steroids.  Will discuss A1C with patient when appropriate as this appears to be a new diagnosis.  Will also order LWWD booklet.   Thanks  Adah Perl, RN, BC-ADM Inpatient Diabetes Coordinator Pager [phone] (8a-5p)

## 2018-10-29 NOTE — Progress Notes (Signed)
Patient taken off oxygen sats decreased to 85-86%.  Placed back on 1L [place].

## 2018-10-29 NOTE — Progress Notes (Signed)
Telephone call to  Henrine Screws, patient's wife, updated on plan of care and answered all questions.

## 2018-10-29 NOTE — Progress Notes (Signed)
Telemetry monitor notified patient HR 39 bpm.  Patient found to be sleeping in prone position. BP 154/86.  HR increased to 48bpm when awakened. Noted to be sinus   Dr. Waldron Labs notified. EKG ordered.

## 2018-10-29 NOTE — Progress Notes (Signed)
PROGRESS NOTE                                                                                                                                                                                                             Patient Demographics:    Scott Nicholson, is a 43 y.o. male, DOB - 03/13/1976, WGN:[id number]  Outpatient Primary MD for the patient is Patient, No Pcp Per    LOS - 4  Chief Complaint  Patient presents with  . Shortness of Breath  . Cough       Brief Narrative: Patient is a 43 y.o. male with no past medical history-presented with fever, cough-found to have COVID-19 viral pneumonia.   Subjective:   Looks comfortable, thin saturation dropped to 88% once stopped his 2 L nasal cannula , saturation back to 91% once back on 2 L nasal cannula .    Assessment  & Plan :   Acute hypoxic respiratory failure secondary to COVID-19 pneumonia:   - Relatively stable, remains mildly hypoxic at rest still requiring 2 L nasal cannula, telemetry markers trending down which is reassuring, new to wean oxygen as tolerated, continue with zinc, vitamin C, continue with Remdesivir and IV steroids,  - Given relative clinical stability-no indication to escalate care with Actemra and or convalescent plasma.    COVID-19 Labs:  Recent Labs    10/27/18 0215 10/28/18 0410 10/29/18 0312  DDIMER 0.80* 0.84* 0.81*  FERRITIN 842* 835* 717*  CRP 9.3* 4.8* 2.2*    No results found for: SARSCOV2NAA   COVID-19 Medications: 6/27>> Solu-Medrol 6/29>> Remdesivr  Diabetes mellitus -Hemoglobin A1c is elevated at 2.7, has hyperglycemia, started on sliding scale, will start on metformin before discharge  ABG: No results found for: PHART, PCO2ART, PO2ART, HCO3, TCO2, ACIDBASEDEF, O2SAT  Condition -Stable  Family Communication  :  D/W patient  Code Status :  Full Code  Diet :  Diet Order            Diet regular Room service  appropriate? Yes; Fluid consistency: Thin  Diet effective now               Disposition Plan  :  Remain inpatient  DVT Prophylaxis  :  Lovenox   Lab Results  Component Value Date   PLT 277 10/29/2018    Inpatient Medications  Scheduled Meds: . benzonatate  200 mg Oral TID  . cholecalciferol  1,000 Units Oral Daily  . enoxaparin (LOVENOX) injection  40 mg Subcutaneous Q24H  . insulin aspart  0-9 Units Subcutaneous TID WC  . ipratropium  2 puff Inhalation TID  . levalbuterol  2 puff Inhalation Q8H  . methylPREDNISolone (SOLU-MEDROL) injection  60 mg Intravenous Q12H  . vitamin C  1,000 mg Oral Daily  . zinc sulfate  220 mg Oral Daily   Continuous Infusions: . remdesivir 100 mg in NS 250 mL 100 mg (10/29/18 0948)   PRN Meds:.acetaminophen, chlorpheniramine-HYDROcodone, dextromethorphan-guaiFENesin, ondansetron **OR** ondansetron (ZOFRAN) IV  Antibiotics  :    Anti-infectives (From admission, onward)   Start     Dose/Rate Route Frequency Ordered Stop   10/28/18 1000  remdesivir 100 mg in sodium chloride 0.9 % 250 mL IVPB     100 mg 500 mL/hr over 30 Minutes Intravenous Every 24 hours 10/27/18 0942 11/01/18 0959   10/27/18 1000  remdesivir 200 mg in sodium chloride 0.9 % 250 mL IVPB     200 mg 500 mL/hr over 30 Minutes Intravenous Once 10/27/18 0942 10/27/18 1205   10/25/18 0500  azithromycin (ZITHROMAX) 500 mg in sodium chloride 0.9 % 250 mL IVPB  Status:  Discontinued     500 mg 250 mL/hr over 60 Minutes Intravenous Every 24 hours 10/25/18 0359 10/25/18 1059   10/25/18 0400  cefTRIAXone (ROCEPHIN) 1 g in sodium chloride 0.9 % 100 mL IVPB  Status:  Discontinued     1 g 200 mL/hr over 30 Minutes Intravenous Every 24 hours 10/25/18 0359 10/25/18 1059       Objective:   Vitals:   10/28/18 2009 10/29/18 0504 10/29/18 0733 10/29/18 0734  BP: (!) 152/95  (!) 143/84   Pulse:   (!) 51   Resp: 20  (!) 29   Temp: 97.8 F (36.6 C) 98.3 F (36.8 C)  98.1 F (36.7 C)   TempSrc: Oral Oral  Oral  SpO2: 90%  98%   Weight:      Height:        Wt Readings from Last 3 Encounters:  10/24/18 83.9 kg     Intake/Output Summary (Last 24 hours) at 10/29/2018 1023 Last data filed at 10/29/2018 0815 Gross per 24 hour  Intake 1458.82 ml  Output -  Net 1458.82 ml     Physical Exam  Awake Alert, Oriented X 3, No new F.N deficits, Normal affect Symmetrical Chest wall movement, Good air movement bilaterally, CTAB RRR,No Gallops,Rubs or new Murmurs, No Parasternal Heave +ve B.Sounds, Abd Soft, No tenderness, No rebound - guarding or rigidity. No Cyanosis, Clubbing or edema, No new Rash or bruise       Data Review:    CBC Recent Labs  Lab 10/25/18 0144 10/26/18 0452 10/27/18 0215 10/28/18 0410 10/29/18 0312  WBC 10.2 14.5* 19.5* 15.9* 12.6*  HGB 15.4 14.3 14.0 14.5 14.3  HCT 46.4 43.5 43.1 44.3 43.5  PLT 127* 172 230 256 277  MCV 88.4 89.3 89.2 89.3 88.1  MCH 29.3 29.4 29.0 29.2 28.9  MCHC 33.2 32.9 32.5 32.7 32.9  RDW 12.1 12.1 12.0 11.9 11.9  LYMPHSABS 1.7  --   --   --   --   MONOABS 0.5  --   --   --   --   EOSABS 0.0  --   --   --   --   BASOSABS 0.0  --   --   --   --  Chemistries  Recent Labs  Lab 10/25/18 0144 10/26/18 0452 10/27/18 0215 10/28/18 0410 10/29/18 0312  NA 135 139 138 139 137  K 4.2 4.2 4.5 4.5 4.5  CL 100 104 102 102 103  CO2 24 24 24 25 24   GLUCOSE 138* 206* 209* 226* 290*  BUN 8 18 23* 29* 27*  CREATININE 1.18 0.82 0.87 0.88 0.82  CALCIUM 8.2* 8.6* 8.5* 8.4* 8.4*  AST 40 28 28 29 25   ALT 50* 39 39 46* 49*  ALKPHOS 58 54 63 69 60  BILITOT 0.8 0.7 0.4 0.8 0.5   ------------------------------------------------------------------------------------------------------------------ No results for input(s): CHOL, HDL, LDLCALC, TRIG, CHOLHDL, LDLDIRECT in the last 72 hours.  Lab Results  Component Value Date   HGBA1C 6.7 (H) 10/29/2018    ------------------------------------------------------------------------------------------------------------------ No results for input(s): TSH, T4TOTAL, T3FREE, THYROIDAB in the last 72 hours.  Invalid input(s): FREET3 ------------------------------------------------------------------------------------------------------------------ Recent Labs    10/28/18 0410 10/29/18 0312  FERRITIN 835* 717*    Coagulation profile No results for input(s): INR, PROTIME in the last 168 hours.  Recent Labs    10/28/18 0410 10/29/18 0312  DDIMER 0.84* 0.81*    Cardiac Enzymes No results for input(s): CKMB, TROPONINI, MYOGLOBIN in the last 168 hours.  Invalid input(s): CK ------------------------------------------------------------------------------------------------------------------    Component Value Date/Time   BNP 24.9 10/25/2018 0610    Micro Results Recent Results (from the past 240 hour(s))  Blood Culture (routine x 2)     Status: None (Preliminary result)   Collection Time: 10/25/18  1:35 AM   Specimen: BLOOD RIGHT ARM  Result Value Ref Range Status   Specimen Description BLOOD RIGHT ARM  Final   Special Requests   Final    BOTTLES DRAWN AEROBIC AND ANAEROBIC Blood Culture adequate volume   Culture   Final    NO GROWTH 4 DAYS Performed at [facility], [address], [place], [place] [address]    Report Status PENDING  Incomplete  Blood Culture (routine x 2)     Status: None (Preliminary result)   Collection Time: 10/25/18  1:47 AM   Specimen: BLOOD RIGHT HAND  Result Value Ref Range Status   Specimen Description BLOOD RIGHT HAND  Final   Special Requests   Final    AEROBIC BOTTLE ONLY Blood Culture results may not be optimal due to an excessive volume of blood received in culture bottles   Culture   Final    NO GROWTH 4 DAYS Performed at [facility], [place] [address] [place], [place] [address]    Report Status PENDING  Incomplete    Radiology Reports  Dg Chest Portable 1 View  Result Date: 10/25/2018 CLINICAL DATA:  Shortness of breath EXAM: PORTABLE CHEST 1 VIEW COMPARISON:  None. FINDINGS: There are multifocal airspace opacities bilaterally. The heart size is normal. There is no pneumothorax. No large pleural effusion. No acute osseous abnormality. No large pleural effusion. IMPRESSION: Multifocal airspace opacities concerning for multifocal pneumonia (viral or bacterial). Electronically Signed   By: Constance Holster M.D.   On: 10/25/2018 00:31   Admit date - 10/24/2018    4    Time Spent in minutes  25   Phillips Climes M.D on 10/29/2018 at 10:23 AM  To page go to [email or web address] - use universal password  [facility] -  Office  [phone]  See all Orders from today for further details

## 2018-10-29 NOTE — Progress Notes (Addendum)
Pt sleeping.  No changes noted.  No s/sx of distress.  CLWR.  0410  Pt sleeping.  No changes noted.  No s/sx of distress.  CLWR.

## 2018-10-30 LAB — CULTURE, BLOOD (ROUTINE X 2)
Culture: NO GROWTH
Culture: NO GROWTH
Special Requests: ADEQUATE

## 2018-10-30 LAB — CBC
HCT: 43.5 % (ref 39.0–52.0)
Hemoglobin: 14.6 g/dL (ref 13.0–17.0)
MCH: 29.3 pg (ref 26.0–34.0)
MCHC: 33.6 g/dL (ref 30.0–36.0)
MCV: 87.2 fL (ref 80.0–100.0)
Platelets: 317 10*3/uL (ref 150–400)
RBC: 4.99 MIL/uL (ref 4.22–5.81)
RDW: 11.7 % (ref 11.5–15.5)
WBC: 12.6 10*3/uL — ABNORMAL HIGH (ref 4.0–10.5)
nRBC: 0.2 % (ref 0.0–0.2)

## 2018-10-30 LAB — GLUCOSE, CAPILLARY
Glucose-Capillary: 204 mg/dL — ABNORMAL HIGH (ref 70–99)
Glucose-Capillary: 385 mg/dL — ABNORMAL HIGH (ref 70–99)
Glucose-Capillary: 332 mg/dL — ABNORMAL HIGH (ref 70–99)
Glucose-Capillary: 311 mg/dL — ABNORMAL HIGH (ref 70–99)

## 2018-10-30 LAB — COMPREHENSIVE METABOLIC PANEL
ALT: 54 U/L — ABNORMAL HIGH (ref 0–44)
AST: 24 U/L (ref 15–41)
Albumin: 3.1 g/dL — ABNORMAL LOW (ref 3.5–5.0)
Alkaline Phosphatase: 54 U/L (ref 38–126)
Anion gap: 11 (ref 5–15)
BUN: 24 mg/dL — ABNORMAL HIGH (ref 6–20)
CO2: 24 mmol/L (ref 22–32)
Calcium: 8.4 mg/dL — ABNORMAL LOW (ref 8.9–10.3)
Chloride: 101 mmol/L (ref 98–111)
Creatinine, Ser: 0.76 mg/dL (ref 0.61–1.24)
GFR calc Af Amer: >60 mL/min (ref >60)
GFR calc non Af Amer: >60 mL/min (ref >60)
Glucose, Bld: 246 mg/dL — ABNORMAL HIGH (ref 70–99)
Potassium: 4.1 mmol/L (ref 3.5–5.1)
Sodium: 136 mmol/L (ref 135–145)
Total Bilirubin: 0.5 mg/dL (ref 0.3–1.2)
Total Protein: 6.7 g/dL (ref 6.5–8.1)

## 2018-10-30 LAB — C-REACTIVE PROTEIN: CRP: 1.3 mg/dL — ABNORMAL HIGH (ref <1.0)

## 2018-10-30 LAB — D-DIMER, QUANTITATIVE: D-Dimer, Quant: 0.75 ug/mL-FEU — ABNORMAL HIGH (ref 0.00–0.50)

## 2018-10-30 LAB — FERRITIN: Ferritin: 695 ng/mL — ABNORMAL HIGH (ref 24–336)

## 2018-10-30 MED ORDER — INSULIN DETEMIR 100 UNIT/ML ~~LOC~~ SOLN
15.0000 [IU] | Freq: Two times a day (BID) | SUBCUTANEOUS | Status: DC
  Administered 2018-10-30 – 2018-10-31 (×3): 15 [IU] via SUBCUTANEOUS
  Filled 2018-10-30 (×3): qty 0.15

## 2018-10-30 MED ORDER — METFORMIN HCL 850 MG PO TABS
850.0000 mg | ORAL_TABLET | Freq: Two times a day (BID) | ORAL | Status: DC
  Administered 2018-10-30 – 2018-10-31 (×3): 850 mg via ORAL
  Filled 2018-10-30 (×4): qty 1

## 2018-10-30 NOTE — Progress Notes (Signed)
PROGRESS NOTE                                                                                                                                                                                                             Patient Demographics:    Scott Nicholson, is a 43 y.o. male, DOB - 1975/10/12, XNA:[id number]  Outpatient Primary MD for the patient is Patient, No Pcp Per    LOS - 5  Chief Complaint  Patient presents with  . Shortness of Breath  . Cough       Brief Narrative:  Patient is a 43 y.o. male with no past medical history-presented with fever, cough-found to have COVID-19 viral pneumonia.   Subjective:   40 is feeling better today, still reports some cough, was able to ambulate intermittently in the room without oxygen requirement, he was on 2 L nasal cannula, which I did get back to room air, with O2 saturation remained 91% .   Assessment  & Plan :   Acute hypoxic respiratory failure secondary to COVID-19 pneumonia:   - Relatively stable, he was on 2 L nasal cannula this morning, I did cut him down to room air, which he tolerated very well at rest, I have encouraged him to ambulate with staff today, discussed with staff will try to ambulate on room air today . - continue with zinc, vitamin C, continue with Remdesivir (day 4 out 5) and continue  IV steroids,  - Given relative clinical stability-no indication to escalate care with Actemra and or convalescent plasma.    COVID-19 Labs:  Recent Labs    10/28/18 0410 10/29/18 0312 10/30/18 0300 10/30/18 0350  DDIMER 0.84* 0.81* 0.75*  --   FERRITIN 835* 717*  --  695*  CRP 4.8* 2.2*  --  1.3*    No results found for: SARSCOV2NAA   COVID-19 Medications: 6/27>> Solu-Medrol 6/29>> Remdesivr  Diabetes mellitus -Hemoglobin A1c is elevated at 6.7, has hyperglycemia, started on sliding scale, Levemir, as well started on metformin, discussed with patient at  length today .  ABG: No results found for: PHART, PCO2ART, PO2ART, HCO3, TCO2, ACIDBASEDEF, O2SAT  Condition -Stable  Family Communication  :  D/W patient  Code Status :  Full Code  Diet :  Diet Order  Diet heart healthy/carb modified Room service appropriate? Yes; Fluid consistency: Thin  Diet effective now               Disposition Plan  :  Remain inpatient  DVT Prophylaxis  :  Lovenox   Lab Results  Component Value Date   PLT 317 10/30/2018    Inpatient Medications  Scheduled Meds: . benzonatate  200 mg Oral TID  . cholecalciferol  1,000 Units Oral Daily  . enoxaparin (LOVENOX) injection  40 mg Subcutaneous Q24H  . insulin aspart  0-15 Units Subcutaneous TID WC  . insulin aspart  0-5 Units Subcutaneous QHS  . insulin detemir  15 Units Subcutaneous BID  . ipratropium  2 puff Inhalation TID  . levalbuterol  2 puff Inhalation Q8H  . metFORMIN  850 mg Oral BID WC  . methylPREDNISolone (SOLU-MEDROL) injection  60 mg Intravenous Q12H  . vitamin C  1,000 mg Oral Daily  . zinc sulfate  220 mg Oral Daily   Continuous Infusions: . remdesivir 100 mg in NS 250 mL 100 mg (10/30/18 1057)   PRN Meds:.acetaminophen, chlorpheniramine-HYDROcodone, dextromethorphan-guaiFENesin, ondansetron **OR** ondansetron (ZOFRAN) IV  Antibiotics  :    Anti-infectives (From admission, onward)   Start     Dose/Rate Route Frequency Ordered Stop   10/28/18 1000  remdesivir 100 mg in sodium chloride 0.9 % 250 mL IVPB     100 mg 500 mL/hr over 30 Minutes Intravenous Every 24 hours 10/27/18 0942 11/01/18 0959   10/27/18 1000  remdesivir 200 mg in sodium chloride 0.9 % 250 mL IVPB     200 mg 500 mL/hr over 30 Minutes Intravenous Once 10/27/18 0942 10/27/18 1205   10/25/18 0500  azithromycin (ZITHROMAX) 500 mg in sodium chloride 0.9 % 250 mL IVPB  Status:  Discontinued     500 mg 250 mL/hr over 60 Minutes Intravenous Every 24 hours 10/25/18 0359 10/25/18 1059   10/25/18 0400   cefTRIAXone (ROCEPHIN) 1 g in sodium chloride 0.9 % 100 mL IVPB  Status:  Discontinued     1 g 200 mL/hr over 30 Minutes Intravenous Every 24 hours 10/25/18 0359 10/25/18 1059       Objective:   Vitals:   10/30/18 0900 10/30/18 1000 10/30/18 1100 10/30/18 1200  BP:      Pulse: 74 78 79 73  Resp: (!) 26 (!) 25 (!) 23 (!) 27  Temp:      TempSrc:      SpO2: 90% 90% 93% 92%  Weight:      Height:        Wt Readings from Last 3 Encounters:  10/24/18 83.9 kg     Intake/Output Summary (Last 24 hours) at 10/30/2018 1314 Last data filed at 10/30/2018 0800 Gross per 24 hour  Intake 480 ml  Output -  Net 480 ml     Physical Exam  Awake Alert, Oriented X 3, No new F.N deficits, Normal affect Symmetrical Chest wall movement, Good air movement bilaterally, CTAB RRR,No Gallops,Rubs or new Murmurs, No Parasternal Heave +ve B.Sounds, Abd Soft, No tenderness, No rebound - guarding or rigidity. No Cyanosis, Clubbing or edema, No new Rash or bruise      Data Review:    CBC Recent Labs  Lab 10/25/18 0144 10/26/18 0452 10/27/18 0215 10/28/18 0410 10/29/18 0312 10/30/18 0300  WBC 10.2 14.5* 19.5* 15.9* 12.6* 12.6*  HGB 15.4 14.3 14.0 14.5 14.3 14.6  HCT 46.4 43.5 43.1 44.3 43.5 43.5  PLT 127* 172 230  256 277 317  MCV 88.4 89.3 89.2 89.3 88.1 87.2  MCH 29.3 29.4 29.0 29.2 28.9 29.3  MCHC 33.2 32.9 32.5 32.7 32.9 33.6  RDW 12.1 12.1 12.0 11.9 11.9 11.7  LYMPHSABS 1.7  --   --   --   --   --   MONOABS 0.5  --   --   --   --   --   EOSABS 0.0  --   --   --   --   --   BASOSABS 0.0  --   --   --   --   --     Chemistries  Recent Labs  Lab 10/26/18 0452 10/27/18 0215 10/28/18 0410 10/29/18 0312 10/30/18 0300  NA 139 138 139 137 136  K 4.2 4.5 4.5 4.5 4.1  CL 104 102 102 103 101  CO2 24 24 25 24 24   GLUCOSE 206* 209* 226* 290* 246*  BUN 18 23* 29* 27* 24*  CREATININE 0.82 0.87 0.88 0.82 0.76  CALCIUM 8.6* 8.5* 8.4* 8.4* 8.4*  AST 28 28 29 25 24   ALT 39 39 46* 49*  54*  ALKPHOS 54 63 69 60 54  BILITOT 0.7 0.4 0.8 0.5 0.5   ------------------------------------------------------------------------------------------------------------------ No results for input(s): CHOL, HDL, LDLCALC, TRIG, CHOLHDL, LDLDIRECT in the last 72 hours.  Lab Results  Component Value Date   HGBA1C 6.7 (H) 10/29/2018   ------------------------------------------------------------------------------------------------------------------ No results for input(s): TSH, T4TOTAL, T3FREE, THYROIDAB in the last 72 hours.  Invalid input(s): FREET3 ------------------------------------------------------------------------------------------------------------------ Recent Labs    10/29/18 0312 10/30/18 0350  FERRITIN 717* 695*    Coagulation profile No results for input(s): INR, PROTIME in the last 168 hours.  Recent Labs    10/29/18 0312 10/30/18 0300  DDIMER 0.81* 0.75*    Cardiac Enzymes No results for input(s): CKMB, TROPONINI, MYOGLOBIN in the last 168 hours.  Invalid input(s): CK ------------------------------------------------------------------------------------------------------------------    Component Value Date/Time   BNP 24.9 10/25/2018 [id number]    Micro Results Recent Results (from the past 240 hour(s))  Blood Culture (routine x 2)     Status: None   Collection Time: 10/25/18  1:35 AM   Specimen: BLOOD RIGHT ARM  Result Value Ref Range Status   Specimen Description BLOOD RIGHT ARM  Final   Special Requests   Final    BOTTLES DRAWN AEROBIC AND ANAEROBIC Blood Culture adequate volume   Culture   Final    NO GROWTH 5 DAYS Performed at [facility], [address] [place], [place] [address]    Report Status 10/30/2018 FINAL  Final  Blood Culture (routine x 2)     Status: None   Collection Time: 10/25/18  1:47 AM   Specimen: BLOOD RIGHT HAND  Result Value Ref Range Status   Specimen Description BLOOD RIGHT HAND  Final   Special Requests   Final     AEROBIC BOTTLE ONLY Blood Culture results may not be optimal due to an excessive volume of blood received in culture bottles   Culture   Final    NO GROWTH 5 DAYS Performed at [facility], [address] [place], [place] [address]    Report Status 10/30/2018 FINAL  Final    Radiology Reports Dg Chest Portable 1 View  Result Date: 10/25/2018 CLINICAL DATA:  Shortness of breath EXAM: PORTABLE CHEST 1 VIEW COMPARISON:  None. FINDINGS: There are multifocal airspace opacities bilaterally. The heart size is normal. There is no pneumothorax. No  large pleural effusion. No acute osseous abnormality. No large pleural effusion. IMPRESSION: Multifocal airspace opacities concerning for multifocal pneumonia (viral or bacterial). Electronically Signed   By: Katherine Mantlehristopher  Green M.D.   On: 10/25/2018 00:31   Admit date - 10/24/2018    4    Time Spent in minutes  25   Huey Bienenstockawood Corazon Nickolas M.D on 10/29/2018 at 10:23 AM  To page go to [email or web address] - use universal password  [facility] -  Office  [phone]  See all Orders from today for further details

## 2018-10-31 LAB — COMPREHENSIVE METABOLIC PANEL
ALT: 51 U/L — ABNORMAL HIGH (ref 0–44)
AST: 19 U/L (ref 15–41)
Albumin: 3.1 g/dL — ABNORMAL LOW (ref 3.5–5.0)
Alkaline Phosphatase: 50 U/L (ref 38–126)
Anion gap: 10 (ref 5–15)
BUN: 26 mg/dL — ABNORMAL HIGH (ref 6–20)
CO2: 25 mmol/L (ref 22–32)
Calcium: 8.3 mg/dL — ABNORMAL LOW (ref 8.9–10.3)
Chloride: 100 mmol/L (ref 98–111)
Creatinine, Ser: 0.81 mg/dL (ref 0.61–1.24)
GFR calc Af Amer: >60 mL/min (ref >60)
GFR calc non Af Amer: >60 mL/min (ref >60)
Glucose, Bld: 226 mg/dL — ABNORMAL HIGH (ref 70–99)
Potassium: 4.2 mmol/L (ref 3.5–5.1)
Sodium: 135 mmol/L (ref 135–145)
Total Bilirubin: 0.7 mg/dL (ref 0.3–1.2)
Total Protein: 6.6 g/dL (ref 6.5–8.1)

## 2018-10-31 LAB — CBC
HCT: 44.4 % (ref 39.0–52.0)
Hemoglobin: 15 g/dL (ref 13.0–17.0)
MCH: 29.2 pg (ref 26.0–34.0)
MCHC: 33.8 g/dL (ref 30.0–36.0)
MCV: 86.5 fL (ref 80.0–100.0)
Platelets: 332 10*3/uL (ref 150–400)
RBC: 5.13 MIL/uL (ref 4.22–5.81)
RDW: 11.7 % (ref 11.5–15.5)
WBC: 14 10*3/uL — ABNORMAL HIGH (ref 4.0–10.5)
nRBC: 0.3 % — ABNORMAL HIGH (ref 0.0–0.2)

## 2018-10-31 LAB — FERRITIN: Ferritin: 577 ng/mL — ABNORMAL HIGH (ref 24–336)

## 2018-10-31 LAB — GLUCOSE, CAPILLARY: Glucose-Capillary: 166 mg/dL — ABNORMAL HIGH (ref 70–99)

## 2018-10-31 LAB — C-REACTIVE PROTEIN: CRP: 0.8 mg/dL (ref <1.0)

## 2018-10-31 MED ORDER — DEXAMETHASONE 6 MG PO TABS: 6.0000 mg | ORAL_TABLET | Freq: Every day | ORAL | 0 refills | Status: DC

## 2018-10-31 MED ORDER — METFORMIN HCL 850 MG PO TABS: 850.0000 mg | ORAL_TABLET | Freq: Two times a day (BID) | ORAL | 0 refills | Status: DC

## 2018-10-31 MED ORDER — ZINC SULFATE 220 (50 ZN) MG PO CAPS: 220.0000 mg | ORAL_CAPSULE | Freq: Every day | ORAL | 0 refills | Status: AC

## 2018-10-31 MED ORDER — ASCORBIC ACID 1000 MG PO TABS: 500.0000 mg | ORAL_TABLET | Freq: Every day | ORAL | Status: AC

## 2018-10-31 NOTE — Discharge Summary (Signed)
Scott Nicholson, is a 43 y.o. male  DOB 04-Dec-1975  MRN [id number].  Admission date:  10/24/2018  Admitting Physician  Lorretta HarpXilin Niu, MD  Discharge Date:  10/31/2018   Primary MD  Patient, No Pcp Per  Recommendations for primary care physician for things to follow:  - please check CBC,CMP during next visit.   Admission Diagnosis  Acute respiratory disease due to COVID-19 virus [U07.1, J06.9]   Discharge Diagnosis  Acute respiratory disease due to COVID-19 virus [U07.1, J06.9]    Principal Problem:   Acute respiratory disease due to COVID-19 virus Active Problems:   Sepsis (HCC)   Abnormal LFTs   Thrombocytopenia (HCC)      History reviewed. No pertinent past medical history.  History reviewed. No pertinent surgical history.     History of present illness and  Hospital Course:     Kindly see H&P for history of present illness and admission details, please review complete Labs, Consult reports and Test reports for all details in brief  HPI  from the history and physical done on the day of admission 10/24/2018  HPI: Scott Nicholson is a 43 y.o. male without significant medical history, who presents with fever, shortness breath and cough.  Patient speaks little [place], history is obtained with his wife's help who speaks good [place]. He states that he has been havingshortness of breath,drycough, fever, chills, body aches, malaise in the past 8 days.Patient states thatboth he and his wife tested positive for COVID-19 on 6/22. He also reports intermittent chest pressure, currently he denies chest pain. He reports that over the last 24 hours that his shortness of breath has significantly worsened.He reports that he is getting short of breath walking from room to room.He also reports that his fever has been persistently high. He has been treating himself with Tylenol, with some improvement. Patient  denies nausea vomiting, diarrhea, abdominal pain, symptoms of UTI or unilateral weakness.   ED Course: pt was found to have WBC 10.2, negative urinalysis, electrolytes renal function okay, liver function (ALP 58, AST 40, ALT 50, total bilirubin 0.8), CRP 16.5, LDH 234, procalcitonin 0.015, ferritin 757, TG 80, temperature 100.9, tachycardia, tachypnea, oxygen saturation 92 to 95% on room air.  Chest x-ray showed multifocal infiltration.  Patient is admitted to telemetry bed as inpatient.    Hospital Course  Patient is a42 y.o.malewith no past medical history-presented with fever, cough-found to have COVID-19 viral pneumonia.  Acute hypoxic respiratory failure secondary to COVID-19 pneumonia:  -Required 2 L nasal cannula through most of his stay, but were able to be weaned down, currently  on room air - CVAT Remdesivir x5 days, treated with IV steroids, he will be discharged on Decadron, inflammatory markers trending down, which is reassuring .     COVID-19 Labs:  Recent Labs (last 2 labs)         Recent Labs    10/28/18 0410 10/29/18 0312 10/30/18 0300 10/30/18 0350  DDIMER 0.84* 0.81* 0.75*  --   FERRITIN 835* 717*  --  695*  CRP 4.8* 2.2*  --  1.3*      Recent Labs  No results found for: SARSCOV2NAA     COVID-19 Medications: 6/27>> Solu-Medrol 6/29>> Remdesivr  Diabetes mellitus -Hemoglobin A1c is elevated at 6.7, has hyperglycemia, he was kept on sliding scale and Levemir during hospital stay, continues to have elevated CBG, most likely due to steroids, discussed with the patient about A1c 6.7 is diagnostic with diabetes mellitus, educated about diet and exercise, he will be discharged on metformin .   Discharge Condition:  stable     Discharge Instructions  and  Discharge Medications     Discharge Instructions    Discharge instructions   Complete by: As directed    Follow with Primary MD Patient, No Pcp Per in 7 days   Get CBC, CMP, checked  by  Primary MD next visit.    Activity: As tolerated with Full fall precautions use walker/cane & assistance as needed   Disposition Home    Diet: Carb modified diet , with feeding assistance and aspiration precautions.  For Heart failure patients - Check your Weight same time everyday, if you gain over 2 pounds, or you develop in leg swelling, experience more shortness of breath or chest pain, call your Primary MD immediately. Follow Cardiac Low Salt Diet and 1.5 lit/day fluid restriction.   On your next visit with your primary care physician please Get Medicines reviewed and adjusted.   Please request your Prim.MD to go over all Hospital Tests and Procedure/Radiological results at the follow up, please get all Hospital records sent to your Prim MD by signing hospital release before you go home.   If you experience worsening of your admission symptoms, develop shortness of breath, life threatening emergency, suicidal or homicidal thoughts you must seek medical attention immediately by calling 911 or calling your MD immediately  if symptoms less severe.  You Must read complete instructions/literature along with all the possible adverse reactions/side effects for all the Medicines you take and that have been prescribed to you. Take any new Medicines after you have completely understood and accpet all the possible adverse reactions/side effects.   Do not drive, operating heavy machinery, perform activities at heights, swimming or participation in water activities or provide baby sitting services if your were admitted for syncope or siezures until you have seen by Primary MD or a Neurologist and advised to do so again.  Do not drive when taking Pain medications.    Do not take more than prescribed Pain, Sleep and Anxiety Medications  Special Instructions: If you have smoked or chewed Tobacco  in the last 2 yrs please stop smoking, stop any regular Alcohol  and or any Recreational drug  use.  Wear Seat belts while driving.   Please note  You were cared for by a hospitalist during your hospital stay. If you have any questions about your discharge medications or the care you received while you were in the hospital after you are discharged, you can call the unit and asked to speak with the hospitalist on call if the hospitalist that took care of you is not available. Once you are discharged, your primary care physician will handle any further medical issues. Please note that NO REFILLS for any discharge medications will be authorized once you are discharged, as it is imperative that you return to your primary care physician (or establish a relationship with a primary care physician if you do not have one) for your aftercare needs  so that they can reassess your need for medications and monitor your lab values.   Increase activity slowly   Complete by: As directed    MyChart COVID-19 home monitoring program   Complete by: Oct 31, 2018    Is the patient willing to use the [place] for home monitoring?: Yes   Temperature monitoring   Complete by: Oct 31, 2018    After how many days would you like to receive a notification of this patient's flowsheet entries?: 1     Allergies as of 10/31/2018   No Known Allergies     Medication List    STOP taking these medications   acetaminophen 500 MG tablet Commonly known as: TYLENOL     TAKE these medications   ascorbic acid 1000 MG tablet Commonly known as: VITAMIN C Take 0.5 tablets (500 mg total) by mouth daily. Take for 10 days Start taking on: November 01, 2018   dexamethasone 6 MG tablet Commonly known as: DECADRON Take 1 tablet (6 mg total) by mouth daily. Take for 5 day Start taking on: November 01, 2018   metFORMIN 850 MG tablet Commonly known as: GLUCOPHAGE Take 1 tablet (850 mg total) by mouth 2 (two) times daily with a meal.   zinc sulfate 220 (50 Zn) MG capsule Take 1 capsule (220 mg total) by mouth daily. Take  for 10 days Start taking on: November 01, 2018         Diet and Activity recommendation: See Discharge Instructions above   Consults obtained -  None   Major procedures and Radiology Reports - PLEASE review detailed and final reports for all details, in brief -    Dg Chest Portable 1 View  Result Date: 10/25/2018 CLINICAL DATA:  Shortness of breath EXAM: PORTABLE CHEST 1 VIEW COMPARISON:  None. FINDINGS: There are multifocal airspace opacities bilaterally. The heart size is normal. There is no pneumothorax. No large pleural effusion. No acute osseous abnormality. No large pleural effusion. IMPRESSION: Multifocal airspace opacities concerning for multifocal pneumonia (viral or bacterial). Electronically Signed   By: Constance Holster M.D.   On: 10/25/2018 00:31    Micro Results    Recent Results (from the past 240 hour(s))  Blood Culture (routine x 2)     Status: None   Collection Time: 10/25/18  1:35 AM   Specimen: BLOOD RIGHT ARM  Result Value Ref Range Status   Specimen Description BLOOD RIGHT ARM  Final   Special Requests   Final    BOTTLES DRAWN AEROBIC AND ANAEROBIC Blood Culture adequate volume   Culture   Final    NO GROWTH 5 DAYS Performed at [facility], [address], [place], [place] [address]    Report Status 10/30/2018 FINAL  Final  Blood Culture (routine x 2)     Status: None   Collection Time: 10/25/18  1:47 AM   Specimen: BLOOD RIGHT HAND  Result Value Ref Range Status   Specimen Description BLOOD RIGHT HAND  Final   Special Requests   Final    AEROBIC BOTTLE ONLY Blood Culture results may not be optimal due to an excessive volume of blood received in culture bottles   Culture   Final    NO GROWTH 5 DAYS Performed at [facility], [place] [address] [place], [place] [address]    Report Status 10/30/2018 FINAL  Final       Today   Subjective:   Scott Nicholson today has  no headache,no chest or abdominal pain, patient was able to  ambulate in the hallway with no hypoxia, feels much better wants to go home today.   Objective:   Blood pressure 128/86, pulse 70, temperature 97.7 F (36.5 C), temperature source Oral, resp. rate 18, height 5\' 10"  (1.778 m), weight 83.9 kg, SpO2 92 %.   Intake/Output Summary (Last 24 hours) at 10/31/2018 1303 Last data filed at 10/31/2018 1037 Gross per 24 hour  Intake 600 ml  Output 550 ml  Net 50 ml    Exam Awake Alert, Oriented x 3, No new F.N deficits, Normal affect Symmetrical Chest wall movement, Good air movement bilaterally, CTAB RRR,No Gallops,Rubs or new Murmurs, No Parasternal Heave +ve B.Sounds, Abd Soft, Non tender, No organomegaly appriciated, No rebound -guarding or rigidity. No Cyanosis, Clubbing or edema, No new Rash or bruise  Data Review   CBC w Diff:  Lab Results  Component Value Date   WBC 14.0 (H) 10/31/2018   HGB 15.0 10/31/2018   HCT 44.4 10/31/2018   PLT 332 10/31/2018   LYMPHOPCT 17 10/25/2018   MONOPCT 5 10/25/2018   EOSPCT 0 10/25/2018   BASOPCT 0 10/25/2018    CMP:  Lab Results  Component Value Date   NA 135 10/31/2018   K 4.2 10/31/2018   CL 100 10/31/2018   CO2 25 10/31/2018   BUN 26 (H) 10/31/2018   CREATININE 0.81 10/31/2018   PROT 6.6 10/31/2018   ALBUMIN 3.1 (L) 10/31/2018   BILITOT 0.7 10/31/2018   ALKPHOS 50 10/31/2018   AST 19 10/31/2018   ALT 51 (H) 10/31/2018  .   Total Time in preparing paper work, data evaluation and todays exam - 35 minutes  Huey Bienenstockawood Kaeden Mester M.D on 10/31/2018 at 1:03 PM  [facility]   Office  [phone]

## 2018-10-31 NOTE — Discharge Instructions (Signed)
Person Under Monitoring Name: Scott Nicholson  Location: [place] [address]   Infection Prevention Recommendations for Individuals Confirmed to have, or Being Evaluated for, 2019 Novel Coronavirus (COVID-19) Infection Who Receive Care at Home  Individuals who are confirmed to have, or are being evaluated for, COVID-19 should follow the prevention steps below until a healthcare provider or local or state health department says they can return to normal activities.  Stay home except to get medical care You should restrict activities outside your home, except for getting medical care. Do not go to work, school, or public areas, and do not use public transportation or taxis.  Call ahead before visiting your doctor Before your medical appointment, call the healthcare provider and tell them that you have, or are being evaluated for, COVID-19 infection. This will help the healthcare providers office take steps to keep other people from getting infected. Ask your healthcare provider to call the local or state health department.  Monitor your symptoms Seek prompt medical attention if your illness is worsening (e.g., difficulty breathing). Before going to your medical appointment, call the healthcare provider and tell them that you have, or are being evaluated for, COVID-19 infection. Ask your healthcare provider to call the local or state health department.  Wear a facemask You should wear a facemask that covers your nose and mouth when you are in the same room with other people and when you visit a healthcare provider. People who live with or visit you should also wear a facemask while they are in the same room with you.  Separate yourself from other people in your home As much as possible, you should stay in a different room from other people in your home. Also, you should use a separate bathroom, if available.  Avoid sharing household items You should not share  dishes, drinking glasses, cups, eating utensils, towels, bedding, or other items with other people in your home. After using these items, you should wash them thoroughly with soap and water.  Cover your coughs and sneezes Cover your mouth and nose with a tissue when you cough or sneeze, or you can cough or sneeze into your sleeve. Throw used tissues in a lined trash can, and immediately wash your hands with soap and water for at least 20 seconds or use an alcohol-based hand rub.  Wash your [facility] your hands often and thoroughly with soap and water for at least 20 seconds. You can use an alcohol-based hand sanitizer if soap and water are not available and if your hands are not visibly dirty. Avoid touching your eyes, nose, and mouth with unwashed hands.   Prevention Steps for Caregivers and Household Members of Individuals Confirmed to have, or Being Evaluated for, COVID-19 Infection Being Cared for in the Home  If you live with, or provide care at home for, a person confirmed to have, or being evaluated for, COVID-19 infection please follow these guidelines to prevent infection:  Follow healthcare providers instructions Make sure that you understand and can help the patient follow any healthcare provider instructions for all care.  Provide for the patients basic needs You should help the patient with basic needs in the home and provide support for getting groceries, prescriptions, and other personal needs.  Monitor the patients symptoms If they are getting sicker, call his or her medical provider and tell them that the patient has, or is being evaluated for, COVID-19 infection. This will help the healthcare providers office  take steps to keep other people from getting infected. Ask the healthcare provider to call the local or state health department.  Limit the number of people who have contact with the patient  If possible, have only one caregiver for the patient.  Other  household members should stay in another home or place of residence. If this is not possible, they should stay  in another room, or be separated from the patient as much as possible. Use a separate bathroom, if available.  Restrict visitors who do not have an essential need to be in the home.  Keep older adults, very young children, and other sick people away from the patient Keep older adults, very young children, and those who have compromised immune systems or chronic health conditions away from the patient. This includes people with chronic heart, lung, or kidney conditions, diabetes, and cancer.  Ensure good ventilation Make sure that shared spaces in the home have good air flow, such as from an air conditioner or an opened window, weather permitting.  Wash your hands often  Wash your hands often and thoroughly with soap and water for at least 20 seconds. You can use an alcohol based hand sanitizer if soap and water are not available and if your hands are not visibly dirty.  Avoid touching your eyes, nose, and mouth with unwashed hands.  Use disposable paper towels to dry your hands. If not available, use dedicated cloth towels and replace them when they become wet.  Wear a facemask and gloves  Wear a disposable facemask at all times in the room and gloves when you touch or have contact with the patients blood, body fluids, and/or secretions or excretions, such as sweat, saliva, sputum, nasal mucus, vomit, urine, or feces.  Ensure the mask fits over your nose and mouth tightly, and do not touch it during use.  Throw out disposable facemasks and gloves after using them. Do not reuse.  Wash your hands immediately after removing your facemask and gloves.  If your personal clothing becomes contaminated, carefully remove clothing and launder. Wash your hands after handling contaminated clothing.  Place all used disposable facemasks, gloves, and other waste in a lined container before  disposing them with other household waste.  Remove gloves and wash your hands immediately after handling these items.  Do not share dishes, glasses, or other household items with the patient  Avoid sharing household items. You should not share dishes, drinking glasses, cups, eating utensils, towels, bedding, or other items with a patient who is confirmed to have, or being evaluated for, COVID-19 infection.  After the person uses these items, you should wash them thoroughly with soap and water.  Wash laundry thoroughly  Immediately remove and wash clothes or bedding that have blood, body fluids, and/or secretions or excretions, such as sweat, saliva, sputum, nasal mucus, vomit, urine, or feces, on them.  Wear gloves when handling laundry from the patient.  Read and follow directions on labels of laundry or clothing items and detergent. In general, wash and dry with the warmest temperatures recommended on the label.  Clean all areas the individual has used often  Clean all touchable surfaces, such as counters, tabletops, doorknobs, bathroom fixtures, toilets, phones, keyboards, tablets, and bedside tables, every day. Also, clean any surfaces that may have blood, body fluids, and/or secretions or excretions on them.  Wear gloves when cleaning surfaces the patient has come in contact with.  Use a diluted bleach solution (e.g., dilute bleach with 1 part  bleach and 10 parts water) or a household disinfectant with a label that says EPA-registered for coronaviruses. To make a bleach solution at home, add 1 tablespoon of bleach to 1 quart (4 cups) of water. For a larger supply, add  cup of bleach to 1 gallon (16 cups) of water.  Read labels of cleaning products and follow recommendations provided on product labels. Labels contain instructions for safe and effective use of the cleaning product including precautions you should take when applying the product, such as wearing gloves or eye protection  and making sure you have good ventilation during use of the product.  Remove gloves and wash hands immediately after cleaning.  Monitor yourself for signs and symptoms of illness Caregivers and household members are considered close contacts, should monitor their health, and will be asked to limit movement outside of the home to the extent possible. Follow the monitoring steps for close contacts listed on the symptom monitoring form.   ? If you have additional questions, contact your local health department or call the epidemiologist on call at [phone] (available 24/7). ? This guidance is subject to change. For the most up-to-date guidance from [facility], please refer to their website: [email or web address]

## 2018-11-03 LAB — GLUCOSE, CAPILLARY: Glucose-Capillary: 232 mg/dL — ABNORMAL HIGH (ref 70–99)

## 2018-11-03 LAB — LEGIONELLA PNEUMOPHILA SEROGP 1 UR AG: L. pneumophila Serogp 1 Ur Ag: NEGATIVE

## 2018-11-13 ENCOUNTER — Ambulatory Visit (HOSPITAL_COMMUNITY)
Admission: RE | Admit: 2018-11-13 | Discharge: 2018-11-13 | Disposition: A | Discharge status: Home / Self Care | Payer: HRSA Program | Source: Ambulatory Visit | Attending: Family Medicine | Admitting: Family Medicine

## 2018-11-13 ENCOUNTER — Other Ambulatory Visit: Payer: Self-pay

## 2018-11-13 ENCOUNTER — Encounter: Payer: Self-pay | Admitting: Family Medicine

## 2018-11-13 ENCOUNTER — Ambulatory Visit (INDEPENDENT_AMBULATORY_CARE_PROVIDER_SITE_OTHER): Payer: HRSA Program | Admitting: Family Medicine

## 2018-11-13 VITALS — BP 132/90 | HR 64 | Temp 98.5°F | Resp 16 | Ht 70.0 in | Wt 189.0 lb

## 2018-11-13 DIAGNOSIS — J069 Acute upper respiratory infection, unspecified: Secondary | ICD-10-CM

## 2018-11-13 DIAGNOSIS — U071 COVID-19: Secondary | ICD-10-CM | POA: Insufficient documentation

## 2018-11-13 DIAGNOSIS — L309 Dermatitis, unspecified: Secondary | ICD-10-CM

## 2018-11-13 DIAGNOSIS — Z1389 Encounter for screening for other disorder: Secondary | ICD-10-CM

## 2018-11-13 DIAGNOSIS — E119 Type 2 diabetes mellitus without complications: Secondary | ICD-10-CM

## 2018-11-13 LAB — POCT URINALYSIS DIPSTICK
Bilirubin, UA: NEGATIVE
Blood, UA: NEGATIVE
Glucose, UA: NEGATIVE
Ketones, UA: NEGATIVE
Leukocytes, UA: NEGATIVE
Nitrite, UA: NEGATIVE
Protein, UA: NEGATIVE
Spec Grav, UA: >=1.03 — AB (ref 1.010–1.025)
Urobilinogen, UA: 0.2 E.U./dL
pH, UA: 5.5 (ref 5.0–8.0)

## 2018-11-13 MED ORDER — METFORMIN HCL 850 MG PO TABS: 850.0000 mg | ORAL_TABLET | Freq: Two times a day (BID) | ORAL | 5 refills | Status: DC

## 2018-11-13 MED ORDER — TRIAMCINOLONE ACETONIDE 0.025 % EX OINT: 1.0000 "application " | TOPICAL_OINTMENT | Freq: Two times a day (BID) | CUTANEOUS | 0 refills | Status: DC

## 2018-11-13 NOTE — Patient Instructions (Addendum)
La diabetes mellitus y las normas bsicas de atencin mdica Diabetes Mellitus and Standards of Medical Care Tratar la diabetes (diabetes mellitus) puede ser complicado. Su tratamiento de la diabetes puede ser administrado por un equipo de profesionales de la salud, que incluye:  Un mdico especializado en diabetes (endocrinlogo).  Un enfermero especializado o auxiliar mdico.  Enfermeros.  Un especialista en dietas y nutricin (nutricionista registrado).  Un educador certificado para el cuidado de la diabetes.  Un especialista en actividad fsica.  Un farmacutico.  Scott Nicholson.  Un especialista en pies (podlogo).  Un dentista.  Un mdico de cabecera.  Un profesional de salud mental. Los mdicos siguen pautas para ayudarlo a Therapist, music la mejor calidad de atencin. El programa siguiente es una gua general para su plan de control de la diabetes. Los mdicos tambin podrn darle instrucciones ms especficas. Exmenes fsicos Tras ser diagnosticado con diabetes mellitus, y cada ao luego de esto, su mdico le preguntar acerca de sus antecedentes mdicos y familiares. Tambin har un examen fsico. Este examen puede incluir:  Medicin de la estatura, peso e ndice de masa corporal [facility]).  Control de la presin arterial. Esto se realiza en cada visita mdica de rutina. La presin arterial deseada puede variar en funcin de sus enfermedades, edad y otros factores.  Examen de la glndula tiroidea.  Examen de la piel.  Examen para deteccin de dao en los nervios (neuropata perifrica). Esto puede incluir controlar el pulso de las piernas y los pies, y el nivel de sensibilidad en las manos y pies.  Un examen de pies completo para inspeccionar la estructura y la piel de los pies, lo que incluye controlar si hay cortes, hematomas, enrojecimiento, ampollas, llagas u otros problemas.  Examen de deteccin de problemas con los vasos sanguneos (vasculares), lo que puede incluir  controlar el pulso de las piernas y los pies y Therapist, sports. Anlisis de Scott Nicholson. Segn el plan de tratamiento y Scott Nicholson necesidades personales, es posible que se le realicen las siguientes pruebas:  Anlisis de HbA1c (hemoglobina A1c). Este anlisis proporciona informacin sobre el control de la glucemia (glucosa en la sangre) durante los ltimos 2 o 23meses. [place] para ajustar el plan de tratamiento, de ser necesario. Este anlisis se har: ? Al menos 2 veces al ao si cumple los objetivos del tratamiento. ? [employer], si no cumple los objetivos del tratamiento o si sus objetivos del [place].  Anlisis de lpidos, lo que incluye colesterol LDL y HDL y niveles de triglicridos. ? En relacin al LDL, el objetivo es tener menos de 100mg /dl (5,5 mmol/l). Si tiene alto riesgo de complicaciones, el objetivo es tener menos de 70 mg/dl (3.9 mmol/l). ? En relacin al HDL, el objetivo es tener 40 mg/dl (2.2 mmol/l) o ms para los hombres y 50 mg/dl (2.8 mmol/l) o ms para las mujeres. Un nivel de colesterol HDL de 60 mg/dl (3.59mmol/l) o superior da una cierta proteccin contra la enfermedad cardaca. ? En relacin a los triglicridos, el objetivo es tener menos de 150 mg/dl (8,3 mmol/l).  Pruebas funcionales hepticas.  Pruebas de la funcin renal.  Pruebas de la funcin tiroidea. Exmenes dentales y [employer]  Visite al [employer] veces por ao.  Si tiene diabetes tipo1, el mdico puede recomendarle que se haga un examen ocular 3 a 5aos despus del diagnstico y, luego, Ardelia Mems vez al ao despus del Tree surgeon. ? Para los nios que tienen diabetes tipo1, el Ecologist un  examen ocular cuando el nio tiene 10aos o ms y ha tenido diabetes durante 3 a 5 aos. Despus del primer examen, el nio debe hacerse un examen ocular una vez al ao.  Si tiene diabetes tipo2, el mdico puede recomendarle que se haga un examen ocular ni bien haya sido  diagnosticado y, luego, Scott Savoyuna vez al ao despus del Risk managerprimer examen. Vacunas   Se recomienda aplicar de forma anual la vacuna contra la gripe (influenza) a todas las personas de 6meses en adelante que tengan diabetes.  La vacuna contra la neumona (antineumoccica) est recomendada para todas las personas de 2aos en adelante que tengan diabetes. Si tiene 65aos o ms, puede recibir Transport plannerla vacuna antineumoccica como una serie de dos inyecciones Ballvillediferentes.  Se recomienda administrar la vacuna contra la hepatitisB en adultos poco despus de que hayan recibido el diagnstico de diabetes.  Los adultos y los nios que tienen diabetes deben recibir todas las vacunas de acuerdo con las recomendaciones especficas segn la edad de los Centros para el Control y la Prevencin de Child psychotherapistnfermedades (Centers for Disease Control and Prevention, CDC). Salud mental y emocional Se recomienda Education officer, environmentalrealizar controles para Engineer, manufacturingdetectar sntomas de trastornos de Psychologist, sport and exercisela alimentacin, ansiedad y depresin en el momento del diagnstico, y en una etapa posterior segn sea necesario. Si los controles revelan la presencia de sntomas (resultado positivo de los controles), es posible que deba someterse a ms evaluaciones y Printmakertrabajar con un profesional de salud mental. Plan de tratamiento Su plan de tratamiento ser revisado en cada visita mdica. Usted y Geophysical data processorel mdico analizarn lo siguiente:  Cmo est recibiendo los medicamentos, incluso la Doverinsulina.  Cualquier efectos secundarios que est experimentando.  Sus objetivos deseados con relacin a la glucemia.  La frecuencia de su control de glucemia.  Hbitos del estilo de vida, como nivel de St. Josephactividad as como consumo de tabaco, alcohol y Scott managerdrogas. Educacin para el autocontrol de la diabetes El mdico evaluar qu tan bien se encuentran los niveles de glucemia y si est recibiendo la cantidad correcta de Scott Nicholson. El mdico puede derivarlo a:  Music therapistUn especialista en educacin para la diabetes  certificado para Company secretarymanejar la diabetes durante toda la vida, comenzando desde el diagnstico.  Un nutricionista matriculado que puede crear o revisar un plan de nutricin personal.  Un especialista en ejercicios que puede analizar el nivel de actividad y un plan de ejercicios. Resumen  Tratar la diabetes (diabetes mellitus) puede ser complicado. Su tratamiento de la diabetes puede ser administrado por un equipo de profesionales de Radiographer, therapeuticla salud.  Los mdicos siguen una gua para ayudarlo a Tourist information centre managerobtener la mejor calidad de atencin.  Las normas asistenciales incluyen realizarse, regularmente, exmenes fsicos, anlisis de sangre y controles de la presin arterial, aplicarse vacunas, someterse a estudios de control e informarse sobre cmo controlar su diabetes.  Sus mdicos tambin podrn darle instrucciones ms especficas basndose en su salud. Esta informacin no tiene Theme park managercomo fin reemplazar el consejo del mdico. Asegrese de hacerle al mdico cualquier pregunta que tenga. Document Released: 07/11/2009 Document Revised: 02/11/2017 Document Reviewed: 09/16/2012 Elsevier Patient Education  2020 Elsevier Inc. COVID-19 COVID-19 is a respiratory infection that is caused by a virus called severe acute respiratory syndrome coronavirus 2 (SARS-CoV-2). The disease is also known as coronavirus disease or novel coronavirus. In some people, the virus may not cause any symptoms. In others, it may cause a serious infection. The infection can get worse quickly and can lead to complications, such as:  Pneumonia, or infection of the lungs.  Acute  respiratory distress syndrome or ARDS. This is fluid build-up in the lungs.  Acute respiratory failure. This is a condition in which there is not enough oxygen passing from the lungs to the body.  Sepsis or septic shock. This is a serious bodily reaction to an infection.  Blood clotting problems.  Secondary infections due to bacteria or fungus. The virus that causes  COVID-19 is contagious. This means that it can spread from person to person through droplets from coughs and sneezes (respiratory secretions). What are the causes? This illness is caused by a virus. You may catch the virus by:  Breathing in droplets from an infected person's cough or sneeze.  Touching something, like a table or a doorknob, that was exposed to the virus (contaminated) and then touching your mouth, nose, or eyes. What increases the risk? Risk for infection You are more likely to be infected with this virus if you:  Live in or travel to an area with a COVID-19 outbreak.  Come in contact with a sick person who recently traveled to an area with a COVID-19 outbreak.  Provide care for or live with a person who is infected with COVID-19. Risk for serious illness You are more likely to become seriously ill from the virus if you:  Are 40 years of age or older.  Have a long-term disease that lowers your body's ability to fight infection (immunocompromised).  Live in a nursing home or long-term care facility.  Have a long-term (chronic) disease such as: ? Chronic lung disease, including chronic obstructive pulmonary disease or asthma ? Heart disease. ? Diabetes. ? Chronic kidney disease. ? Liver disease.  Are obese. What are the signs or symptoms? Symptoms of this condition can range from mild to severe. Symptoms may appear any time from 2 to 14 days after being exposed to the virus. They include:  A fever.  A cough.  Difficulty breathing.  Chills.  Muscle pains.  A sore throat.  Loss of taste or smell. Some people may also have stomach problems, such as nausea, vomiting, or diarrhea. Other people may not have any symptoms of COVID-19. How is this diagnosed? This condition may be diagnosed based on:  Your signs and symptoms, especially if: ? You live in an area with a COVID-19 outbreak. ? You recently traveled to or from an area where the virus is  common. ? You provide care for or live with a person who was diagnosed with COVID-19.  A physical exam.  Lab tests, which may include: ? A nasal swab to take a sample of fluid from your nose. ? A throat swab to take a sample of fluid from your throat. ? A sample of mucus from your lungs (sputum). ? Blood tests.  Imaging tests, which may include, X-rays, CT scan, or ultrasound. How is this treated? At present, there is no medicine to treat COVID-19. Medicines that treat other diseases are being used on a trial basis to see if they are effective against COVID-19. Your health care provider will talk with you about ways to treat your symptoms. For most people, the infection is mild and can be managed at home with rest, fluids, and over-the-counter medicines. Treatment for a serious infection usually takes places in a hospital intensive care unit (ICU). It may include one or more of the following treatments. These treatments are given until your symptoms improve.  Receiving fluids and medicines through an IV.  Supplemental oxygen. Extra oxygen is given through a tube in the  nose, a face mask, or a hood.  Positioning you to lie on your stomach (prone position). This makes it easier for oxygen to get into the lungs.  Continuous positive airway pressure (CPAP) or bi-level positive airway pressure (BPAP) machine. This treatment uses mild air pressure to keep the airways open. A tube that is connected to a motor delivers oxygen to the body.  Ventilator. This treatment moves air into and out of the lungs by using a tube that is placed in your windpipe.  Tracheostomy. This is a procedure to create a hole in the neck so that a breathing tube can be inserted.  Extracorporeal membrane oxygenation (ECMO). This procedure gives the lungs a chance to recover by taking over the functions of the heart and lungs. It supplies oxygen to the body and removes carbon dioxide. Follow these instructions at  home: Lifestyle  If you are sick, stay home except to get medical care. Your health care provider will tell you how long to stay home. Call your health care provider before you go for medical care.  Rest at home as told by your health care provider.  Do not use any products that contain nicotine or tobacco, such as cigarettes, e-cigarettes, and chewing tobacco. If you need help quitting, ask your health care provider.  Return to your normal activities as told by your health care provider. Ask your health care provider what activities are safe for you. General instructions  Take over-the-counter and prescription medicines only as told by your health care provider.  Drink enough fluid to keep your urine pale yellow.  Keep all follow-up visits as told by your health care provider. This is important. How is this prevented?  There is no vaccine to help prevent COVID-19 infection. However, there are steps you can take to protect yourself and others from this virus. To protect yourself:   Do not travel to areas where COVID-19 is a risk. The areas where COVID-19 is reported change often. To identify high-risk areas and travel restrictions, check the CDC travel website: StageSync.siwwwnc.cdc.gov/travel/notices  If you live in, or must travel to, an area where COVID-19 is a risk, take precautions to avoid infection. ? Stay away from people who are sick. ? Wash your hands often with soap and water for 20 seconds. If soap and water are not available, use an alcohol-based hand sanitizer. ? Avoid touching your mouth, face, eyes, or nose. ? Avoid going out in public, follow guidance from your state and local health authorities. ? If you must go out in public, wear a cloth face covering or face mask. ? Disinfect objects and surfaces that are frequently touched every day. This may include:  Counters and tables.  Doorknobs and light switches.  Sinks and faucets.  Electronics, such as phones, remote controls,  keyboards, computers, and tablets. To protect others: If you have symptoms of COVID-19, take steps to prevent the virus from spreading to others.  If you think you have a COVID-19 infection, contact your health care provider right away. Tell your health care team that you think you may have a COVID-19 infection.  Stay home. Leave your house only to seek medical care. Do not use public transport.  Do not travel while you are sick.  Wash your hands often with soap and water for 20 seconds. If soap and water are not available, use alcohol-based hand sanitizer.  Stay away from other members of your household. Let healthy household members care for children and pets, if  possible. If you have to care for children or pets, wash your hands often and wear a mask. If possible, stay in your own room, separate from others. Use a different bathroom.  Make sure that all people in your household wash their hands well and often.  Cough or sneeze into a tissue or your sleeve or elbow. Do not cough or sneeze into your hand or into the air.  Wear a cloth face covering or face mask. Where to find more information  Centers for Disease Control and Prevention: StickerEmporium.tnwww.cdc.gov/coronavirus/2019-ncov/index.html  World Health Organization: https://thompson-craig.com/www.who.int/health-topics/coronavirus Contact a health care provider if:  You live in or have traveled to an area where COVID-19 is a risk and you have symptoms of the infection.  You have had contact with someone who has COVID-19 and you have symptoms of the infection. Get help right away if:  You have trouble breathing.  You have pain or pressure in your chest.  You have confusion.  You have bluish lips and fingernails.  You have difficulty waking from sleep.  You have symptoms that get worse. These symptoms may represent a serious problem that is an emergency. Do not wait to see if the symptoms will go away. Get medical help right away. Call your local emergency  services (911 in the [place] Do not drive yourself to the hospital. Let the emergency medical personnel know if you think you have COVID-19. Summary  COVID-19 is a respiratory infection that is caused by a virus. It is also known as coronavirus disease or novel coronavirus. It can cause serious infections, such as pneumonia, acute respiratory distress syndrome, acute respiratory failure, or sepsis.  The virus that causes COVID-19 is contagious. This means that it can spread from person to person through droplets from coughs and sneezes.  You are more likely to develop a serious illness if you are 43 years of age or older, have a weak immunity, live in a nursing home, or have chronic disease.  There is no medicine to treat COVID-19. Your health care provider will talk with you about ways to treat your symptoms.  Take steps to protect yourself and others from infection. Wash your hands often and disinfect objects and surfaces that are frequently touched every day. Stay away from people who are sick and wear a mask if you are sick. This information is not intended to replace advice given to you by your health care provider. Make sure you discuss any questions you have with your health care provider. Document Released: 05/22/2018 Document Revised: 09/11/2018 Document Reviewed: 05/22/2018 Elsevier Patient Education  2020 [employer]

## 2018-11-13 NOTE — Progress Notes (Signed)
[facility]  New Patient Encounter Provider: Mike Gipndre Righteous Claiborne, FNP    NWG:[id number]  VHQ:[id number]  DOB - Jul 24, 1975  SUBJECTIVE:   Scott Nicholson, is a 43 y.o. male who presents to establish care with this clinic.   Current problems/concerns:  Patient positive for COVID-19 on 10/20/2018. Patient hospitalized from 6/23-10/31/2018. During hosptial stay, he was diagnosed with DM2 and started on Metformin 850 BID at discharge. Patient states that he is compliant with medications and does not report side effects. He reports that a rash presented after discharge. Only on his face. Non-itchy. No respiratory symptoms noted.   No Known Allergies History reviewed. No pertinent past medical history. Current Outpatient Medications on File Prior to Visit  Medication Sig Dispense Refill  . cholecalciferol (VITAMIN D3) 25 MCG (1000 UT) tablet Take 1,000 Units by mouth daily.    . Multiple Vitamins-Minerals (MULTIVITAMIN WITH MINERALS) tablet Take 1 tablet by mouth daily.    . vitamin C (VITAMIN C) 1000 MG tablet Take 0.5 tablets (500 mg total) by mouth daily. Take for 10 days 10 tablet   . zinc sulfate 220 (50 Zn) MG capsule Take 1 capsule (220 mg total) by mouth daily. Take for 10 days 10 capsule 0   No current facility-administered medications on file prior to visit.    Family History  Problem Relation Age of Onset  . Diabetes Mellitus II Mother   . Hypertension Mother    Social History   Socioeconomic History  . Marital status: Married    Spouse name: Not on file  . Number of children: Not on file  . Years of education: Not on file  . Highest education level: Not on file  Occupational History  . Not on file  Social Needs  . Financial resource strain: Not on file  . Food insecurity    Worry: Not on file    Inability: Not on file  . Transportation needs    Medical: Not on file    Non-medical: Not on file  Tobacco Use  . Smoking  status: Never Smoker  . Smokeless tobacco: Never Used  Substance and Sexual Activity  . Alcohol use: Yes    Frequency: Never    Comment: rarely  . Drug use: Never  . Sexual activity: Not on file  Lifestyle  . Physical activity    Days per week: Not on file    Minutes per session: Not on file  . Stress: Not on file  Relationships  . Social Musicianconnections    Talks on phone: Not on file    Gets together: Not on file    Attends religious service: Not on file    Active member of club or organization: Not on file    Attends meetings of clubs or organizations: Not on file    Relationship status: Not on file  . Intimate partner violence    Fear of current or ex partner: Not on file    Emotionally abused: Not on file    Physically abused: Not on file    Forced sexual activity: Not on file  Other Topics Concern  . Not on file  Social History Narrative  . Not on file    Review of Systems  Constitutional: Negative.   HENT: Negative.   Eyes: Negative.   Respiratory: Negative.   Cardiovascular: Negative.   Gastrointestinal: Negative.   Genitourinary: Negative.   Musculoskeletal: Negative.   Skin: Positive for rash.  Neurological: Negative.  Psychiatric/Behavioral: Negative.      OBJECTIVE:    BP 132/90 (BP Location: Left Arm, Patient Position: Sitting, Cuff Size: Normal)   Pulse 64   Temp 98.5 F (36.9 C) (Oral)   Resp 16   Ht 5\' 10"  (1.778 m)   Wt 189 lb (85.7 kg)   SpO2 99%   BMI 27.12 kg/m   Physical Exam  Constitutional: He is oriented to person, place, and time and well-developed, well-nourished, and in no distress. No distress.  HENT:  Head: Normocephalic and atraumatic.  Eyes: Pupils are equal, round, and reactive to light. Conjunctivae and EOM are normal.  Neck: Normal range of motion. Neck supple.  Cardiovascular: Normal rate, regular rhythm and intact distal pulses. Exam reveals no gallop and no friction rub.  No murmur heard. Pulmonary/Chest: Effort  normal and breath sounds normal. No respiratory distress. He has no wheezes.  Abdominal: Soft. Bowel sounds are normal. There is no abdominal tenderness.  Musculoskeletal: Normal range of motion.        General: No tenderness or edema.  Lymphadenopathy:    He has no cervical adenopathy.  Neurological: He is alert and oriented to person, place, and time. Gait normal.  Skin: Skin is warm and dry. Rash noted. Rash is vesicular.     Psychiatric: Mood, memory, affect and judgment normal.  Nursing note and vitals reviewed.    ASSESSMENT/PLAN:  1. Type 2 diabetes mellitus without complication, without long-term current use of insulin ([place]) Continue with metformin 850 BID. Discussed diet and exercise.  - Urinalysis Dipstick - CBC with Differential - Comprehensive metabolic panel - TSH - Lipid Panel  2. Acute respiratory disease due to COVID-19 virus Chest Xray CBC and CMP ordered.  - DG Chest 2 View; Future  3. Dermatitis Possibly COVID related. Only on forehead and nape of neck.  - triamcinolone (KENALOG) 0.025 % ointment; Apply 1 application topically 2 (two) times daily.  Dispense: 30 g; Refill: 0    Return in about 3 months (around 02/13/2019).  The patient was given clear instructions to go to ER or return to medical center if symptoms don't improve, worsen or new problems develop. The patient verbalized understanding. The patient was told to call to get lab results if they haven't heard anything in the next week.     This note has been created with Surveyor, quantity. Any transcriptional errors are unintentional.   Ms. Andr L. Nathaneil Canary, FNP-BC [facility] [address] [place],  [address] [phone]

## 2018-11-14 LAB — COMPREHENSIVE METABOLIC PANEL
ALT: 94 IU/L — ABNORMAL HIGH (ref 0–44)
AST: 51 IU/L — ABNORMAL HIGH (ref 0–40)
Albumin/Globulin Ratio: 1.5 (ref 1.2–2.2)
Albumin: 3.9 g/dL — ABNORMAL LOW (ref 4.0–5.0)
Alkaline Phosphatase: 53 IU/L (ref 39–117)
BUN/Creatinine Ratio: 17 (ref 9–20)
BUN: 15 mg/dL (ref 6–24)
Bilirubin Total: 0.7 mg/dL (ref 0.0–1.2)
CO2: 23 mmol/L (ref 20–29)
Calcium: 8.7 mg/dL (ref 8.7–10.2)
Chloride: 104 mmol/L (ref 96–106)
Creatinine, Ser: 0.86 mg/dL (ref 0.76–1.27)
GFR calc Af Amer: 123 mL/min/{1.73_m2} (ref >59)
GFR calc non Af Amer: 106 mL/min/{1.73_m2} (ref >59)
Globulin, Total: 2.6 g/dL (ref 1.5–4.5)
Glucose: 117 mg/dL — ABNORMAL HIGH (ref 65–99)
Potassium: 4.1 mmol/L (ref 3.5–5.2)
Sodium: 140 mmol/L (ref 134–144)
Total Protein: 6.5 g/dL (ref 6.0–8.5)

## 2018-11-14 LAB — CBC WITH DIFFERENTIAL/PLATELET
Basophils Absolute: 0 10*3/uL (ref 0.0–0.2)
Basos: 0 %
EOS (ABSOLUTE): 0.1 10*3/uL (ref 0.0–0.4)
Eos: 2 %
Hematocrit: 44.3 % (ref 37.5–51.0)
Hemoglobin: 14.6 g/dL (ref 13.0–17.7)
Immature Grans (Abs): 0 10*3/uL (ref 0.0–0.1)
Immature Granulocytes: 0 %
Lymphocytes Absolute: 1.2 10*3/uL (ref 0.7–3.1)
Lymphs: 28 %
MCH: 29.7 pg (ref 26.6–33.0)
MCHC: 33 g/dL (ref 31.5–35.7)
MCV: 90 fL (ref 79–97)
Monocytes Absolute: 0.5 10*3/uL (ref 0.1–0.9)
Monocytes: 11 %
Neutrophils Absolute: 2.5 10*3/uL (ref 1.4–7.0)
Neutrophils: 59 %
Platelets: 114 10*3/uL — ABNORMAL LOW (ref 150–450)
RBC: 4.91 x10E6/uL (ref 4.14–5.80)
RDW: 13.1 % (ref 11.6–15.4)
WBC: 4.2 10*3/uL (ref 3.4–10.8)

## 2018-11-14 LAB — LIPID PANEL
Chol/HDL Ratio: 4.2 ratio (ref 0.0–5.0)
Cholesterol, Total: 207 mg/dL — ABNORMAL HIGH (ref 100–199)
HDL: 49 mg/dL (ref >39)
LDL Calculated: 115 mg/dL — ABNORMAL HIGH (ref 0–99)
Triglycerides: 216 mg/dL — ABNORMAL HIGH (ref 0–149)
VLDL Cholesterol Cal: 43 mg/dL — ABNORMAL HIGH (ref 5–40)

## 2018-11-14 LAB — TSH: TSH: 2.5 u[IU]/mL (ref 0.450–4.500)

## 2018-11-21 ENCOUNTER — Ambulatory Visit: Payer: Self-pay | Admitting: Family Medicine

## 2018-11-24 NOTE — Progress Notes (Signed)
Please let patient know that his lungs are clear but not completely clear. He may continue to have a cough because of this. I would like to repeat a chest xray the first week in August to make sure that both lungs are clear. I placed an order to [facility] for this.

## 2018-11-24 NOTE — Addendum Note (Signed)
Addended by: Genelle Bal on: 11/24/2018 04:49 PM   Modules accepted: Orders

## 2018-11-24 NOTE — Progress Notes (Signed)
Your labs are stable. Continue with your current medications. Please remember to keep your follow up appointment. If you have problems, questions or concerns, please make an appointment to discuss. Thanks!

## 2018-11-25 ENCOUNTER — Telehealth: Payer: Self-pay

## 2018-11-25 NOTE — Telephone Encounter (Signed)
-----   Message from Lanae Boast, [place] sent at 11/24/2018  4:48 PM EDT ----- Please let patient know that his lungs are clear but not completely clear. He may continue to have a cough because of this. I would like to repeat a chest xray the first week in August to make sure that both lungs are clear. I placed an order to [facility] for this.

## 2018-11-25 NOTE — Telephone Encounter (Signed)
Called, spoke with wife (on hippa) patient was not available. Advised that lungs are clear but not completely clear. Advised that he may continue to have a cough because of this. Advised that he should have chest xray repeated the first week of August and that order has been placed for [facility] imaging. Wife verbalized understanding and will have patient repeat xray. Thanks!

## 2018-12-01 ENCOUNTER — Ambulatory Visit
Admission: RE | Admit: 2018-12-01 | Discharge: 2018-12-01 | Disposition: A | Discharge status: Home / Self Care | Payer: No Typology Code available for payment source | Source: Ambulatory Visit | Attending: Family Medicine | Admitting: Family Medicine

## 2018-12-01 DIAGNOSIS — J069 Acute upper respiratory infection, unspecified: Secondary | ICD-10-CM

## 2018-12-03 NOTE — Progress Notes (Signed)
His chest x ray continues to be abnormal. It is showing possible pneumonia. Please see if the patient is having any signs or symptoms of pneumonia. Will refer to pulmonology for further evaluation.

## 2018-12-03 NOTE — Addendum Note (Signed)
Addended by: Genelle Bal on: 12/03/2018 05:13 PM   Modules accepted: Orders

## 2018-12-04 ENCOUNTER — Telehealth: Payer: Self-pay

## 2018-12-04 NOTE — Telephone Encounter (Signed)
-----   Message from Lanae Boast, [place] sent at 12/03/2018  5:12 PM EDT ----- His chest x ray continues to be abnormal. It is showing possible pneumonia. Please see if the patient is having any signs or symptoms of pneumonia. Will refer to pulmonology for further evaluation.

## 2018-12-04 NOTE — Telephone Encounter (Signed)
Called using [employer] [phone]. No answer and voicemail was full. Will try later. Thanks!

## 2018-12-05 NOTE — Telephone Encounter (Signed)
Called and spoke with patient and patient's wife who speaks english. He is having a little shortness of breath at night but is stable during the day. I advised of abnormal xray and that we are sending him to see pulmonology. Patient verbalized understanding. Thanks!

## 2018-12-15 ENCOUNTER — Telehealth: Payer: Self-pay | Admitting: Family Medicine

## 2018-12-15 DIAGNOSIS — E119 Type 2 diabetes mellitus without complications: Secondary | ICD-10-CM

## 2018-12-15 NOTE — Telephone Encounter (Signed)
Done

## 2018-12-19 ENCOUNTER — Institutional Professional Consult (permissible substitution): Payer: Self-pay | Admitting: Critical Care Medicine

## 2018-12-22 ENCOUNTER — Other Ambulatory Visit: Payer: Self-pay

## 2018-12-22 ENCOUNTER — Ambulatory Visit (INDEPENDENT_AMBULATORY_CARE_PROVIDER_SITE_OTHER): Payer: Self-pay | Admitting: Pulmonary Disease

## 2018-12-22 ENCOUNTER — Encounter: Payer: Self-pay | Admitting: Pulmonary Disease

## 2018-12-22 VITALS — BP 124/82 | HR 60 | Temp 98.4°F | Ht 70.0 in | Wt 194.0 lb

## 2018-12-22 DIAGNOSIS — Z8616 Personal history of COVID-19: Secondary | ICD-10-CM | POA: Insufficient documentation

## 2018-12-22 DIAGNOSIS — Z8619 Personal history of other infectious and parasitic diseases: Secondary | ICD-10-CM

## 2018-12-22 NOTE — Progress Notes (Signed)
New Patient Office Visit  Subjective:  Patient ID: Scott DoffingFelipe Marcinek, male    DOB: 1976/03/23  Age: 43 y.o. MRN: [id number]  CC:  Chief Complaint  Patient presents with  . Pulmonary Consult    Referred by Mike GipAndre Douglas for respiratory disease due to Covid. Patient reports sob with exertion and with sleep.     HPI Scott Nicholson presents for evaluation following hospitalization for COVID-19 infection. 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 He was admitted on June 27th for fever, malaise, chills, and weakness. He progressed well symptomatically requiring Remdesivir, decadron and supplemental oxygen but was not treated with Actmera or intubation. On discharge he was to continue his steroid taper. Since that time he has continued to improve symptomatically. He is able to complete a full day of work but feels that he is still a little weak as compared to how he felt before COVID. He is able to walk and take a few flights of stairs without shortness of breath or weakness.  He stated that his doctor repeated his CXR which was read as possible pneumonia and as such he was sent to the pulmonologist for evaluation.   Past Medical History:  Diagnosis Date  . Acute respiratory disease due to COVID-19 virus 10/25/2018    History reviewed. No pertinent surgical history.  Family History  Problem Relation Age of Onset  . Diabetes Mellitus II Mother   . Hypertension Mother     Social History   Socioeconomic History  . Marital status: Married    Spouse name: Not on file  . Number of children: Not on file  . Years of education: Not on file  . Highest education level: Not on file  Occupational History  . Not on file  Social Needs  . Financial resource strain: Not on file  . Food insecurity    Worry: Not on file    Inability: Not on file  . Transportation needs    Medical: Not on file    Non-medical: Not on file  Tobacco Use  . Smoking status: Never  Smoker  . Smokeless tobacco: Never Used  Substance and Sexual Activity  . Alcohol use: Yes    Frequency: Never    Comment: rarely  . Drug use: Never  . Sexual activity: Not on file  Lifestyle  . Physical activity    Days per week: Not on file    Minutes per session: Not on file  . Stress: Not on file  Relationships  . Social Musicianconnections    Talks on phone: Not on file    Gets together: Not on file    Attends religious service: Not on file    Active member of club or organization: Not on file    Attends meetings of clubs or organizations: Not on file    Relationship status: Not on file  . Intimate partner violence    Fear of current or ex partner: Not on file    Emotionally abused: Not on file    Physically abused: Not on file    Forced sexual activity: Not on file  Other Topics Concern  . Not on file  Social History Narrative  . Not on file    ROS Review of Systems  Constitutional: Positive for fatigue. Negative for activity change, appetite change, chills, fever and unexpected weight change.  HENT: Negative for congestion, rhinorrhea, sinus pressure and sinus pain.   Eyes: Negative for pain and discharge.  Respiratory: Positive for shortness of breath. Negative for apnea, cough, choking, chest tightness and wheezing.   Cardiovascular: Positive for chest pain (With deep inspiration). Negative for palpitations and leg swelling.  Gastrointestinal: Negative for abdominal distention, abdominal pain, diarrhea and nausea.  Endocrine: Negative for polyuria.  Genitourinary: Negative for dysuria and hematuria.  Musculoskeletal: Negative for arthralgias, back pain, joint swelling and myalgias.  Skin: Negative for pallor, rash and wound.  Neurological: Negative for dizziness, weakness and headaches.  Psychiatric/Behavioral: Negative for confusion and sleep disturbance. The patient is not nervous/anxious.     Objective:   Today's Vitals: BP 124/82 (BP Location: Left Arm, Cuff  Size: Normal)   Pulse 60   Temp 98.4 F (36.9 C) (Temporal)   Ht 5\' 10"  (1.778 m)   Wt 194 lb (88 kg)   SpO2 97%   BMI 27.84 kg/m   Physical Exam Constitutional:      General: He is not in acute distress.    Appearance: He is well-developed. He is not diaphoretic.  HENT:     Head: Normocephalic and atraumatic.  Eyes:     Conjunctiva/sclera: Conjunctivae normal.  Neck:     Musculoskeletal: Normal range of motion.  Cardiovascular:     Rate and Rhythm: Normal rate and regular rhythm.     Heart sounds: No murmur.  Pulmonary:     Effort: Pulmonary effort is normal. No respiratory distress.     Breath sounds: Normal breath sounds. No stridor.  Abdominal:     General: Bowel sounds are normal. There is no distension.     Palpations: Abdomen is soft.  Musculoskeletal:     Right lower leg: No edema.     Left lower leg: No edema.  Skin:    General: Skin is warm.     Capillary Refill: Capillary refill takes less than 2 seconds.  Neurological:     General: No focal deficit present.     Mental Status: He is alert and oriented to person, place, and time.  Psychiatric:        Mood and Affect: Mood normal.        Behavior: Behavior normal.     Assessment & Plan:   Problem List Items Addressed This Visit      Other   History of 2019 novel coronavirus disease (COVID-19) - Primary    The patient is improving from his recent COVID-19 illness. He will likely completely recover in time. CXR from 12/01/2018 which shows persistent but improved bilateral opacities consistent with early recovery from a viral pneumonia. As pneumonia is a systemic illness and given the patients lack of such symptoms he does not appear to have pneumonia. The imaging alone is insufficient for diagnosis of PNA and likely represents persistent inflammatory findings common of viral/bacterial pulmonary illnesses. Symptom recovery often precedes imaging improvement.  Plan: Repeat CXR in 4-5 weeks  F/up with Dr.  Isaiah SergeMannam to discuss results       Relevant Orders   DG Chest 2 View      Outpatient Encounter Medications as of 12/22/2018  Medication Sig  . cholecalciferol (VITAMIN D3) 25 MCG (1000 UT) tablet Take 1,000 Units by mouth daily.  . metFORMIN (GLUCOPHAGE) 850 MG tablet Take 1 tablet (850 mg total) by mouth 2 (two) times daily with a meal.  . Multiple Vitamins-Minerals (MULTIVITAMIN WITH MINERALS) tablet Take 1 tablet by mouth daily.  Marland Kitchen. triamcinolone (KENALOG) 0.025 % ointment Apply 1 application topically 2 (two) times daily.  . vitamin C (  VITAMIN C) 1000 MG tablet Take 0.5 tablets (500 mg total) by mouth daily. Take for 10 days  . zinc sulfate 220 (50 Zn) MG capsule Take 1 capsule (220 mg total) by mouth daily. Take for 10 days   No facility-administered encounter medications on file as of 12/22/2018.     Follow-up: No follow-ups on file.   Kathi Ludwig, MD

## 2018-12-22 NOTE — Assessment & Plan Note (Addendum)
The patient is improving from his recent COVID-19 illness. He will likely completely recover in time. CXR from 12/01/2018 which shows persistent but improved bilateral opacities consistent with early recovery from a viral pneumonia. As pneumonia is a systemic illness and given the patients lack of such symptoms he does not appear to have pneumonia. The imaging alone is insufficient for diagnosis of PNA and likely represents persistent inflammatory findings common of viral/bacterial pulmonary illnesses. Symptom recovery often precedes imaging improvement.  Plan: Repeat CXR in 4-5 weeks  F/up with Dr. Vaughan Browner to discuss results

## 2018-12-22 NOTE — Patient Instructions (Signed)
Thank you for your visit to the [facility] today. It was a pleasure meeting you. We would like for you to have a repeat chest x-ray in 4-5 weeks and then to follow-up with Dr. Vaughan Browner after this to discuss the results.   Please let us know if you do not continue to feel better or improve. If you develop other concerning symptoms please notify the clinic.

## 2019-01-07 ENCOUNTER — Encounter (HOSPITAL_COMMUNITY): Payer: Self-pay | Admitting: *Deleted

## 2019-01-07 ENCOUNTER — Encounter (HOSPITAL_COMMUNITY): Payer: Self-pay

## 2019-02-13 ENCOUNTER — Ambulatory Visit: Payer: Self-pay | Admitting: Family Medicine

## 2020-11-15 IMAGING — DX PORTABLE CHEST - 1 VIEW
1 series · 1 of 1 positions shown · non-contrast
Comparison: None.

CLINICAL DATA: Shortness of breath

EXAM:
PORTABLE CHEST 1 VIEW

[chest ap]
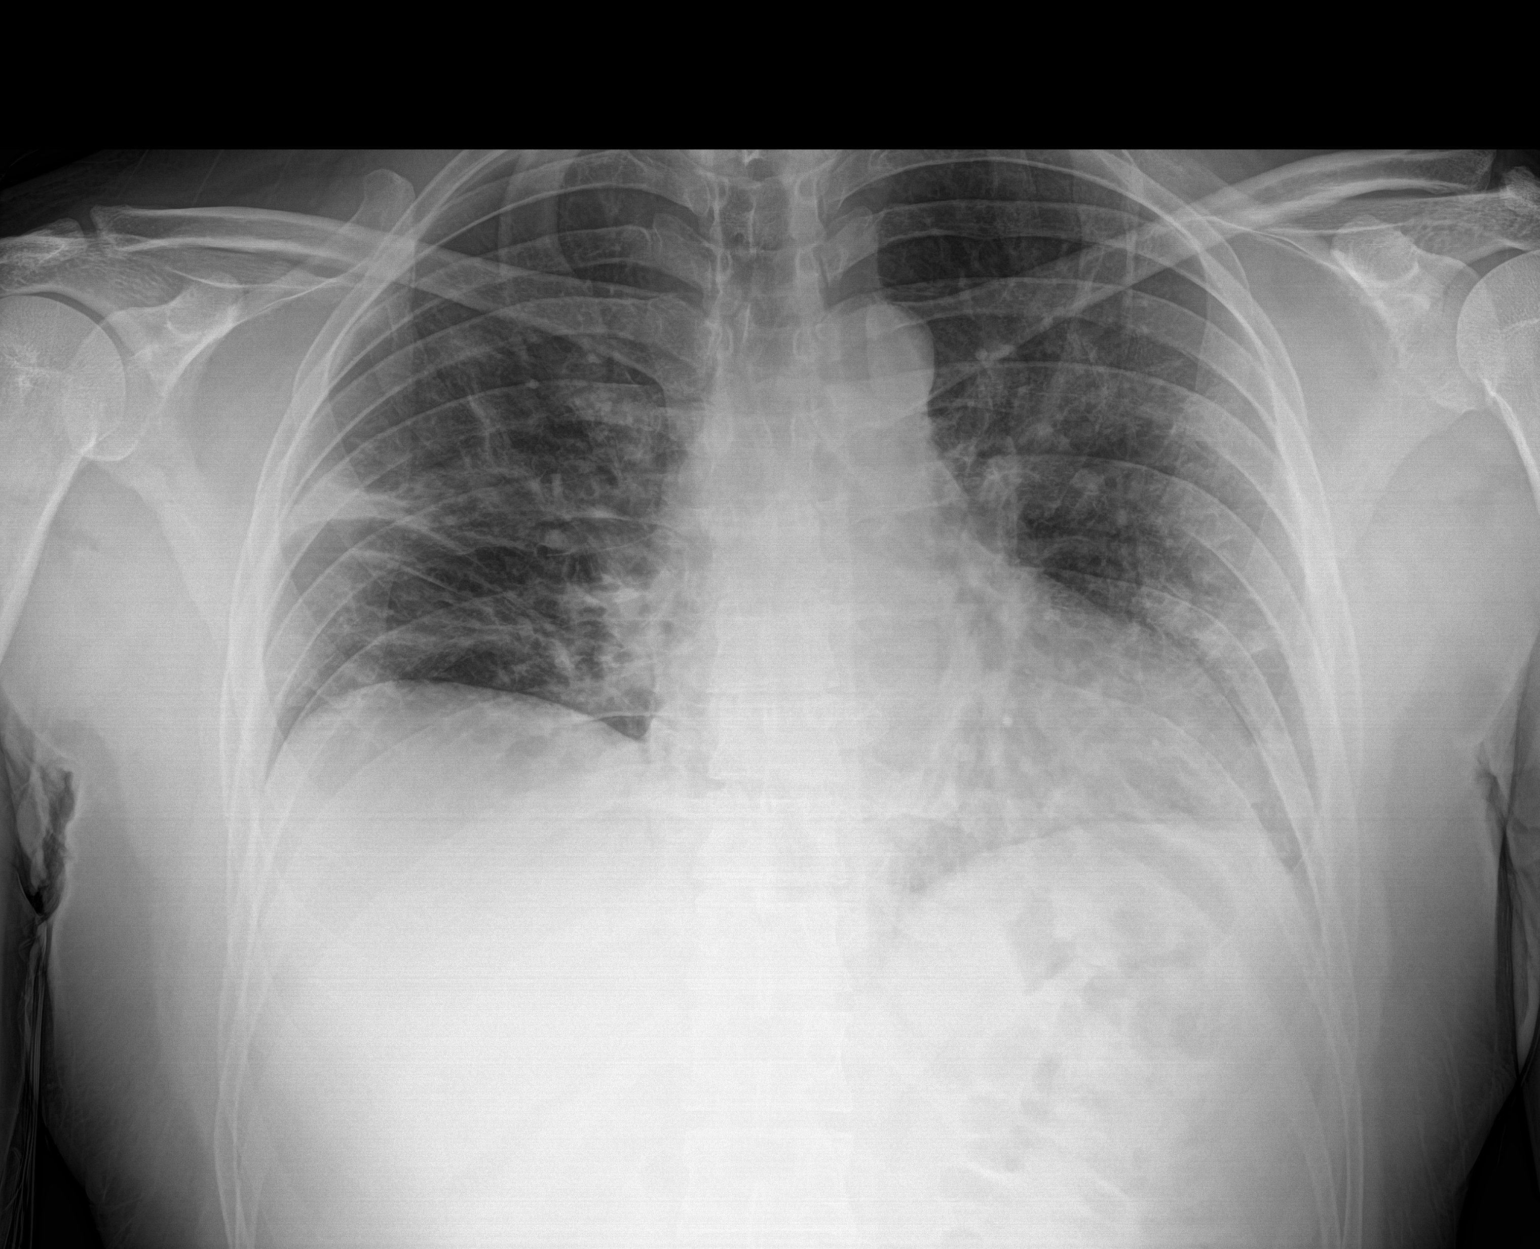

[1 of 1 positions shown; findings below may reference images not displayed]

FINDINGS: There are multifocal airspace opacities bilaterally. The heart size
is normal. There is no pneumothorax. No large pleural effusion. No
acute osseous abnormality. No large pleural effusion.
IMPRESSION: Multifocal airspace opacities concerning for multifocal pneumonia
(viral or bacterial).

## 2020-12-04 IMAGING — CR CHEST - 2 VIEW
2 series · 2 of 2 positions shown · non-contrast
Comparison: October 25, 2018

CLINICAL DATA: Recent positive KIVG2-BO examination

EXAM:
CHEST - 2 VIEW

[w chest pa]
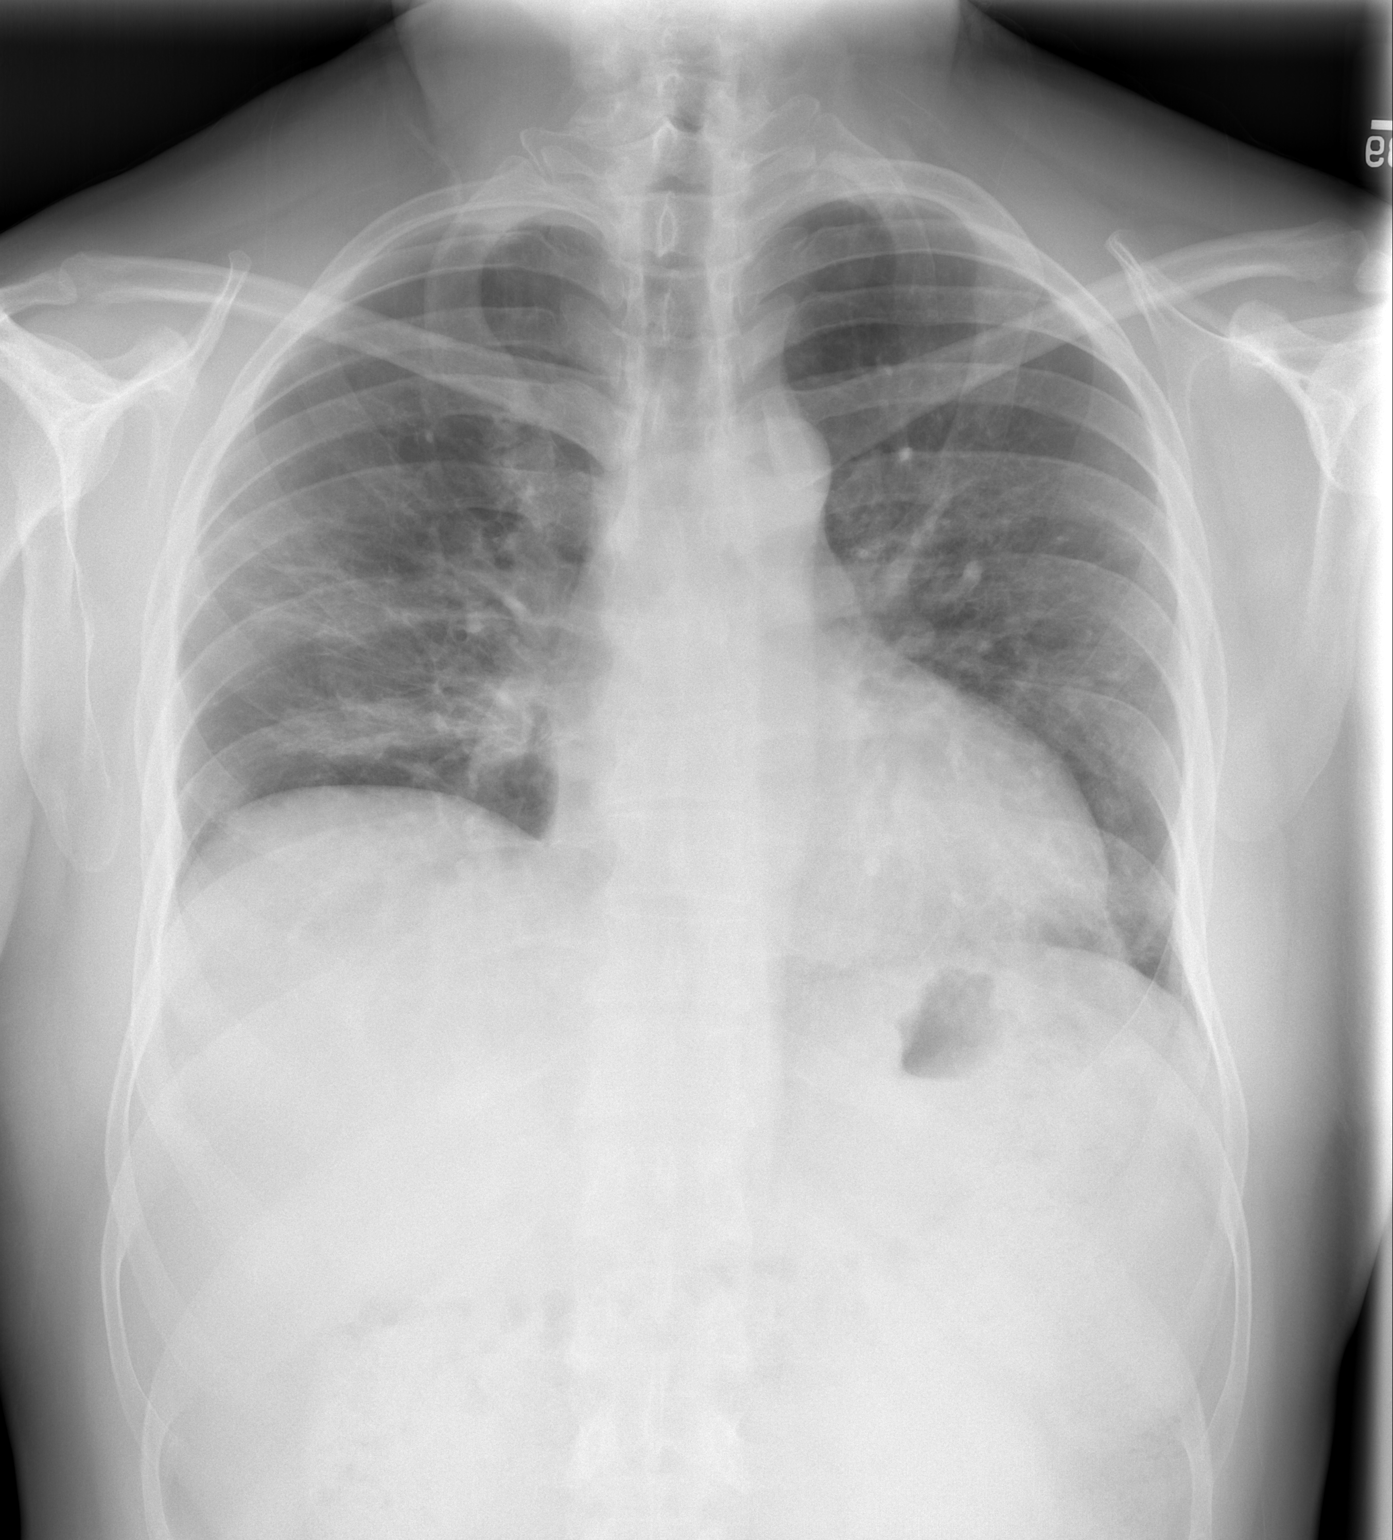

[w chest lat]
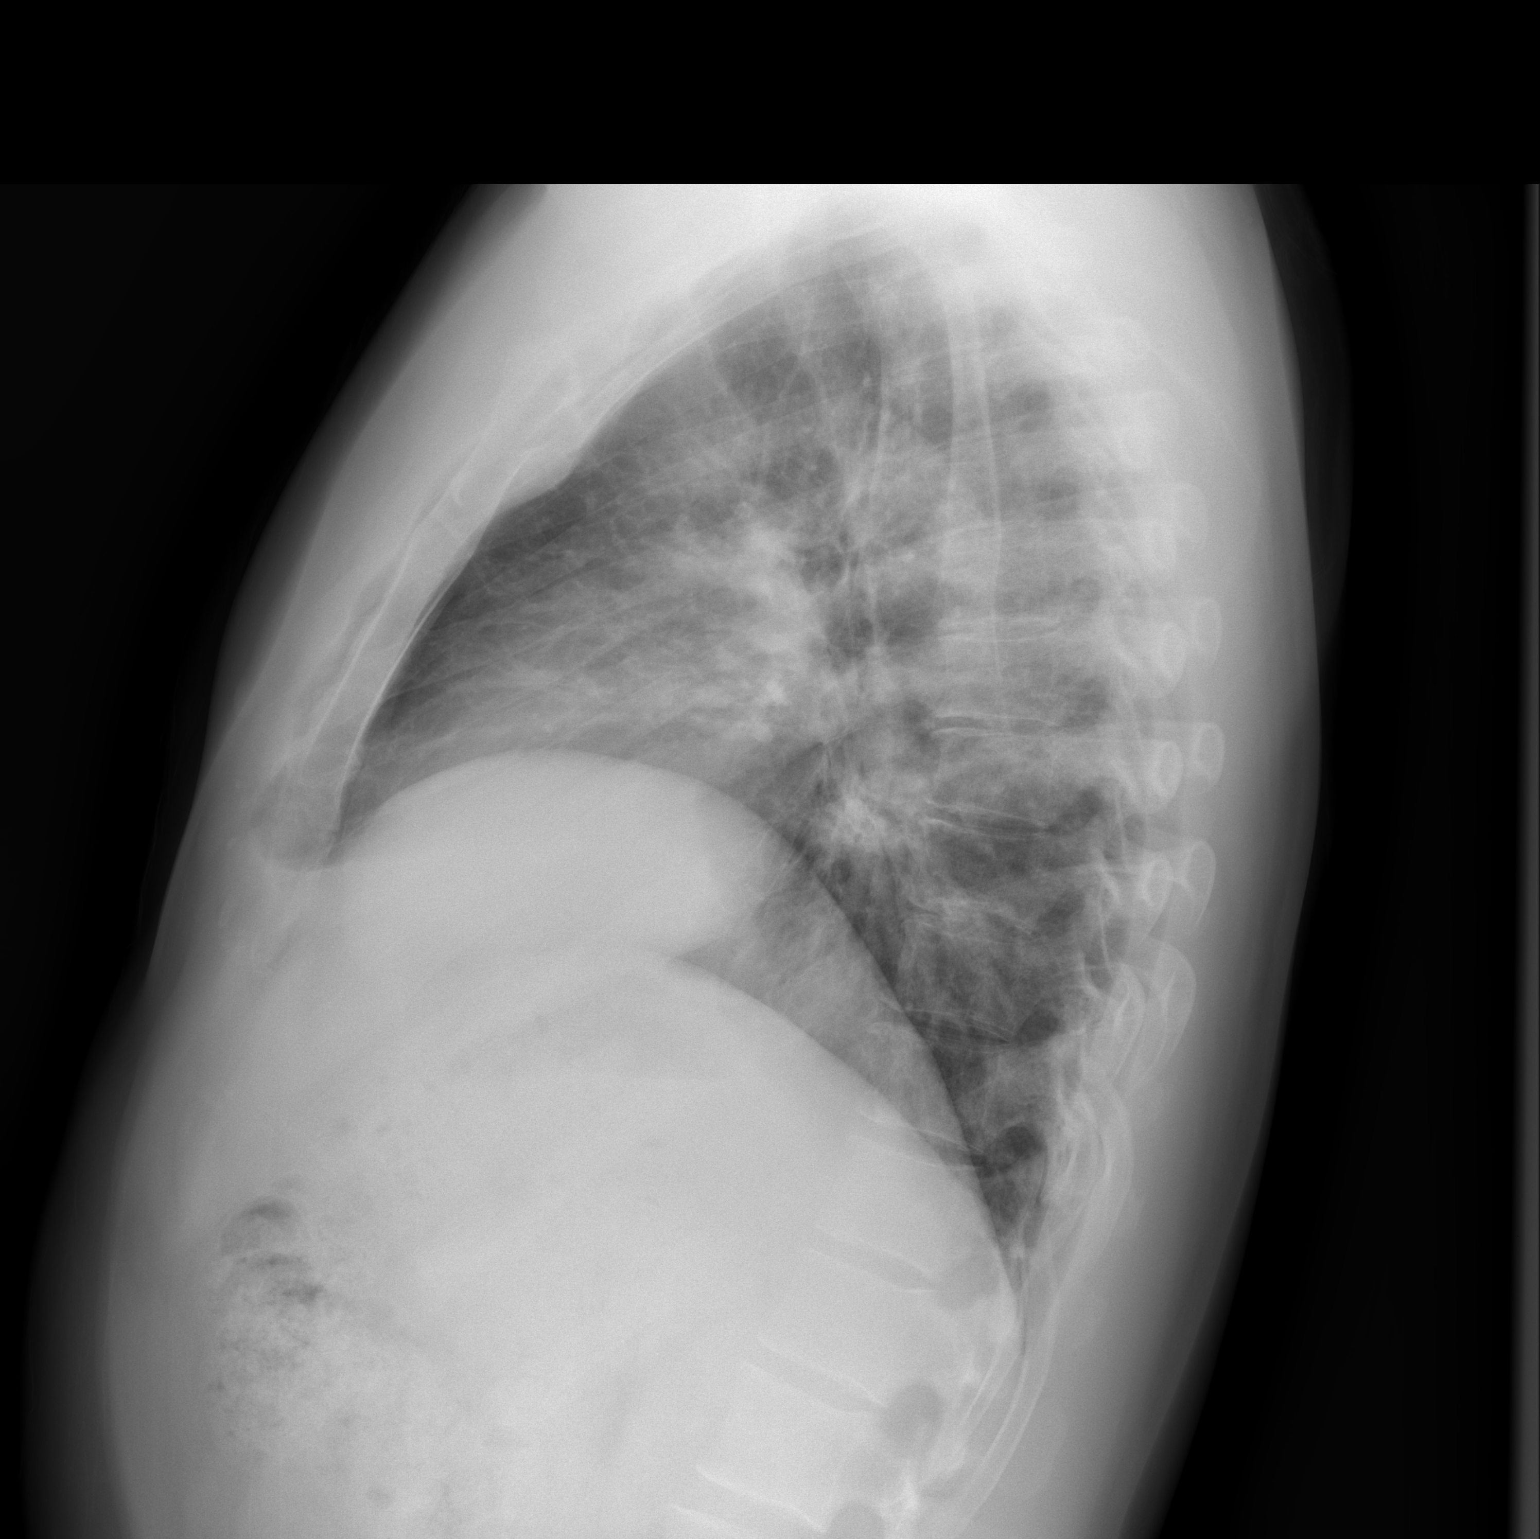

[2 of 2 positions shown; findings below may reference images not displayed]

FINDINGS: There is patchy airspace opacity in the right mid lung and right
base regions. There is subtle infiltrate in the left base. An area
of patchy infiltrate in the left mid lung seen previously has
cleared. Heart is upper normal in size with pulmonary vascularity
normal. No adenopathy. No bone lesions.
IMPRESSION: Currently there is patchy infiltrate in the right mid lung in both
bases. Interval clearing of patchy infiltrate from left mid lung.
Lungs elsewhere clear. Heart upper normal in size. No evident
adenopathy.

## 2021-04-04 ENCOUNTER — Inpatient Hospital Stay (HOSPITAL_COMMUNITY)
Admission: EM | Admit: 2021-04-04 | Discharge: 2021-04-07 | DRG: 603 | Disposition: A | Discharge status: Home / Self Care | Payer: Self-pay | Attending: Family Medicine | Admitting: Family Medicine

## 2021-04-04 ENCOUNTER — Encounter (HOSPITAL_COMMUNITY): Payer: Self-pay

## 2021-04-04 ENCOUNTER — Emergency Department (HOSPITAL_COMMUNITY): Payer: Self-pay

## 2021-04-04 ENCOUNTER — Other Ambulatory Visit: Payer: Self-pay

## 2021-04-04 DIAGNOSIS — I129 Hypertensive chronic kidney disease with stage 1 through stage 4 chronic kidney disease, or unspecified chronic kidney disease: Secondary | ICD-10-CM | POA: Diagnosis present

## 2021-04-04 DIAGNOSIS — X58XXXA Exposure to other specified factors, initial encounter: Secondary | ICD-10-CM | POA: Diagnosis present

## 2021-04-04 DIAGNOSIS — E1122 Type 2 diabetes mellitus with diabetic chronic kidney disease: Secondary | ICD-10-CM | POA: Diagnosis present

## 2021-04-04 DIAGNOSIS — R001 Bradycardia, unspecified: Secondary | ICD-10-CM | POA: Diagnosis present

## 2021-04-04 DIAGNOSIS — T148XXA Other injury of unspecified body region, initial encounter: Secondary | ICD-10-CM

## 2021-04-04 DIAGNOSIS — Z597 Insufficient social insurance and welfare support: Secondary | ICD-10-CM

## 2021-04-04 DIAGNOSIS — Z91120 Patient's intentional underdosing of medication regimen due to financial hardship: Secondary | ICD-10-CM

## 2021-04-04 DIAGNOSIS — L03818 Cellulitis of other sites: Secondary | ICD-10-CM

## 2021-04-04 DIAGNOSIS — Z8249 Family history of ischemic heart disease and other diseases of the circulatory system: Secondary | ICD-10-CM

## 2021-04-04 DIAGNOSIS — N182 Chronic kidney disease, stage 2 (mild): Secondary | ICD-10-CM | POA: Diagnosis present

## 2021-04-04 DIAGNOSIS — Z8616 Personal history of COVID-19: Secondary | ICD-10-CM

## 2021-04-04 DIAGNOSIS — S91302A Unspecified open wound, left foot, initial encounter: Secondary | ICD-10-CM | POA: Diagnosis present

## 2021-04-04 DIAGNOSIS — E1165 Type 2 diabetes mellitus with hyperglycemia: Secondary | ICD-10-CM | POA: Diagnosis present

## 2021-04-04 DIAGNOSIS — T383X6A Underdosing of insulin and oral hypoglycemic [antidiabetic] drugs, initial encounter: Secondary | ICD-10-CM | POA: Diagnosis present

## 2021-04-04 DIAGNOSIS — Z79899 Other long term (current) drug therapy: Secondary | ICD-10-CM

## 2021-04-04 DIAGNOSIS — L0390 Cellulitis, unspecified: Secondary | ICD-10-CM | POA: Diagnosis present

## 2021-04-04 DIAGNOSIS — Z20822 Contact with and (suspected) exposure to covid-19: Secondary | ICD-10-CM | POA: Diagnosis present

## 2021-04-04 DIAGNOSIS — L03116 Cellulitis of left lower limb: Principal | ICD-10-CM | POA: Diagnosis present

## 2021-04-04 DIAGNOSIS — Y92009 Unspecified place in unspecified non-institutional (private) residence as the place of occurrence of the external cause: Secondary | ICD-10-CM

## 2021-04-04 DIAGNOSIS — Z833 Family history of diabetes mellitus: Secondary | ICD-10-CM

## 2021-04-04 HISTORY — DX: Pneumonia, unspecified organism: J18.9

## 2021-04-04 HISTORY — DX: Type 2 diabetes mellitus without complications: E11.9

## 2021-04-04 LAB — COMPREHENSIVE METABOLIC PANEL
ALT: 47 U/L — ABNORMAL HIGH (ref 0–44)
AST: 41 U/L (ref 15–41)
Albumin: 4.3 g/dL (ref 3.5–5.0)
Alkaline Phosphatase: 67 U/L (ref 38–126)
Anion gap: 8 (ref 5–15)
BUN: 19 mg/dL (ref 6–20)
CO2: 27 mmol/L (ref 22–32)
Calcium: 9.3 mg/dL (ref 8.9–10.3)
Chloride: 104 mmol/L (ref 98–111)
Creatinine, Ser: 1.05 mg/dL (ref 0.61–1.24)
GFR, Estimated: >60 mL/min (ref >60)
Glucose, Bld: 136 mg/dL — ABNORMAL HIGH (ref 70–99)
Potassium: 4.2 mmol/L (ref 3.5–5.1)
Sodium: 139 mmol/L (ref 135–145)
Total Bilirubin: 0.6 mg/dL (ref 0.3–1.2)
Total Protein: 8.4 g/dL — ABNORMAL HIGH (ref 6.5–8.1)

## 2021-04-04 LAB — URINALYSIS, ROUTINE W REFLEX MICROSCOPIC
Bilirubin Urine: NEGATIVE
Glucose, UA: NEGATIVE mg/dL
Hgb urine dipstick: NEGATIVE
Ketones, ur: NEGATIVE mg/dL
Leukocytes,Ua: NEGATIVE
Nitrite: NEGATIVE
Protein, ur: NEGATIVE mg/dL
Specific Gravity, Urine: 1.025 (ref 1.005–1.030)
pH: 6 (ref 5.0–8.0)

## 2021-04-04 LAB — CBG MONITORING, ED
Glucose-Capillary: 138 mg/dL — ABNORMAL HIGH (ref 70–99)
Glucose-Capillary: 118 mg/dL — ABNORMAL HIGH (ref 70–99)

## 2021-04-04 LAB — CBC WITH DIFFERENTIAL/PLATELET
Abs Immature Granulocytes: 0.04 10*3/uL (ref 0.00–0.07)
Basophils Absolute: 0 10*3/uL (ref 0.0–0.1)
Basophils Relative: 0 %
Eosinophils Absolute: 0.2 10*3/uL (ref 0.0–0.5)
Eosinophils Relative: 2 %
HCT: 45 % (ref 39.0–52.0)
Hemoglobin: 15.1 g/dL (ref 13.0–17.0)
Immature Granulocytes: 1 %
Lymphocytes Relative: 28 %
Lymphs Abs: 2.4 10*3/uL (ref 0.7–4.0)
MCH: 29.8 pg (ref 26.0–34.0)
MCHC: 33.6 g/dL (ref 30.0–36.0)
MCV: 88.8 fL (ref 80.0–100.0)
Monocytes Absolute: 0.7 10*3/uL (ref 0.1–1.0)
Monocytes Relative: 8 %
Neutro Abs: 5.3 10*3/uL (ref 1.7–7.7)
Neutrophils Relative %: 61 %
Platelets: 242 10*3/uL (ref 150–400)
RBC: 5.07 MIL/uL (ref 4.22–5.81)
RDW: 12.4 % (ref 11.5–15.5)
WBC: 8.5 10*3/uL (ref 4.0–10.5)
nRBC: 0 % (ref 0.0–0.2)

## 2021-04-04 LAB — RESP PANEL BY RT-PCR (FLU A&B, COVID) ARPGX2
Influenza A by PCR: NEGATIVE
Influenza B by PCR: NEGATIVE
SARS Coronavirus 2 by RT PCR: NEGATIVE

## 2021-04-04 LAB — HEMOGLOBIN A1C
Hgb A1c MFr Bld: 6.1 % — ABNORMAL HIGH (ref 4.8–5.6)
Mean Plasma Glucose: 128.37 mg/dL

## 2021-04-04 LAB — PROTIME-INR
INR: 1 (ref 0.8–1.2)
Prothrombin Time: 12.8 seconds (ref 11.4–15.2)

## 2021-04-04 LAB — APTT: aPTT: 29 seconds (ref 24–36)

## 2021-04-04 LAB — LACTIC ACID, PLASMA
Lactic Acid, Venous: 1.2 mmol/L (ref 0.5–1.9)
Lactic Acid, Venous: 1.5 mmol/L (ref 0.5–1.9)

## 2021-04-04 MED ORDER — SODIUM CHLORIDE 0.9 % IV BOLUS
1000.0000 mL | Freq: Once | INTRAVENOUS | Status: AC
  Administered 2021-04-04: 1000 mL via INTRAVENOUS

## 2021-04-04 MED ORDER — HYDRALAZINE HCL 10 MG PO TABS
10.0000 mg | ORAL_TABLET | Freq: Two times a day (BID) | ORAL | Status: DC
  Administered 2021-04-05 – 2021-04-07 (×5): 10 mg via ORAL
  Filled 2021-04-04 (×6): qty 1

## 2021-04-04 MED ORDER — INSULIN ASPART 100 UNIT/ML IJ SOLN
0.0000 [IU] | Freq: Three times a day (TID) | INTRAMUSCULAR | Status: DC
  Administered 2021-04-06 (×2): 1 [IU] via SUBCUTANEOUS
  Filled 2021-04-04: qty 0.09

## 2021-04-04 MED ORDER — SENNOSIDES-DOCUSATE SODIUM 8.6-50 MG PO TABS
2.0000 | ORAL_TABLET | Freq: Every day | ORAL | Status: DC
  Administered 2021-04-04 – 2021-04-05 (×2): 2 via ORAL
  Filled 2021-04-04 (×2): qty 2

## 2021-04-04 MED ORDER — CEFAZOLIN SODIUM-DEXTROSE 1-4 GM/50ML-% IV SOLN
1.0000 g | Freq: Three times a day (TID) | INTRAVENOUS | Status: DC
  Administered 2021-04-05 – 2021-04-07 (×8): 1 g via INTRAVENOUS
  Filled 2021-04-04 (×9): qty 50

## 2021-04-04 MED ORDER — VANCOMYCIN HCL 1750 MG/350ML IV SOLN
1750.0000 mg | Freq: Once | INTRAVENOUS | Status: AC
  Administered 2021-04-04: 1750 mg via INTRAVENOUS
  Filled 2021-04-04: qty 350

## 2021-04-04 MED ORDER — LACTATED RINGERS IV SOLN: INTRAVENOUS | Status: AC

## 2021-04-04 MED ORDER — OXYCODONE HCL 5 MG PO TABS
5.0000 mg | ORAL_TABLET | Freq: Four times a day (QID) | ORAL | Status: DC | PRN
  Administered 2021-04-06 (×2): 5 mg via ORAL
  Filled 2021-04-04 (×2): qty 1

## 2021-04-04 MED ORDER — ONDANSETRON HCL 4 MG/2ML IJ SOLN: 4.0000 mg | Freq: Four times a day (QID) | INTRAMUSCULAR | Status: DC | PRN

## 2021-04-04 MED ORDER — ACETAMINOPHEN 325 MG PO TABS: 650.0000 mg | ORAL_TABLET | Freq: Four times a day (QID) | ORAL | Status: DC | PRN

## 2021-04-04 MED ORDER — POLYETHYLENE GLYCOL 3350 17 G PO PACK: 17.0000 g | PACK | Freq: Every day | ORAL | Status: DC | PRN

## 2021-04-04 MED ORDER — MELATONIN 3 MG PO TABS: 3.0000 mg | ORAL_TABLET | Freq: Every evening | ORAL | Status: DC | PRN

## 2021-04-04 MED ORDER — INSULIN ASPART 100 UNIT/ML IJ SOLN
0.0000 [IU] | Freq: Every day | INTRAMUSCULAR | Status: DC
  Filled 2021-04-04: qty 0.05

## 2021-04-04 MED ORDER — HYDROMORPHONE HCL 1 MG/ML IJ SOLN: 0.5000 mg | INTRAMUSCULAR | Status: DC | PRN

## 2021-04-04 MED ORDER — CEFAZOLIN SODIUM-DEXTROSE 1-4 GM/50ML-% IV SOLN
1.0000 g | Freq: Once | INTRAVENOUS | Status: AC
  Administered 2021-04-04: 1 g via INTRAVENOUS
  Filled 2021-04-04: qty 50

## 2021-04-04 MED ORDER — ENOXAPARIN SODIUM 40 MG/0.4ML IJ SOSY
40.0000 mg | PREFILLED_SYRINGE | INTRAMUSCULAR | Status: DC
  Administered 2021-04-04 – 2021-04-06 (×3): 40 mg via SUBCUTANEOUS
  Filled 2021-04-04 (×3): qty 0.4

## 2021-04-04 NOTE — ED Triage Notes (Signed)
Patient had a blister on the bottom of the left foot that started 5 days ago. Patient now has swelling and pain to the left foot. Patient also noted that he had a large area of redness to the left upper thigh.  Patient's wife reports that during Covid he was diagnosed with diabetes, but she lost her job and no longer had insurance, so the patient has not been taking care of his diabetes.

## 2021-04-04 NOTE — Progress Notes (Signed)
A consult was received from an ED physician for vancomycin per pharmacy dosing.  The patient's profile has been reviewed for ht/wt/allergies/indication/available labs.   A one time order has been placed for vancomycin 1750mg  IV x1.  Further antibiotics/pharmacy consults should be ordered by admitting physician if indicated.                       Thank you,  , PharmD 04/04/2021 4:40 PM

## 2021-04-04 NOTE — ED Provider Notes (Signed)
[facility]-EMERGENCY DEPT Provider Note   CSN: [id number] Arrival date & time: 04/04/21  1412     History Chief Complaint  Patient presents with   Foot Swelling    Leg redness    Scott Nicholson is a 45 y.o. male.  Pt presents to the ED today with left foot swelling and redness.  Pt has had a wound to the bottom of his foot for the past 5 days.  He had fevers over the weekend.  He noticed redness going up his leg today.  It has spread all the way up his leg today.  Pt was diagnosed with DM in June of 2020 when he was hospitalized for Covid.  He has not been taking his metformin due to lack of medical insurance.      Past Medical History:  Diagnosis Date   Acute respiratory disease due to COVID-19 virus 10/25/2018   Diabetes mellitus without complication (HCC)    Pneumonia     Patient Active Problem List   Diagnosis Date Noted   Cellulitis 04/04/2021   History of 2019 novel coronavirus disease (COVID-19) 12/22/2018   Sepsis (HCC) 10/25/2018   Abnormal LFTs 10/25/2018   Thrombocytopenia (HCC) 10/25/2018    History reviewed. No pertinent surgical history.     Family History  Problem Relation Age of Onset   Diabetes Mellitus II Mother    Hypertension Mother     Social History   Tobacco Use   Smoking status: Never   Smokeless tobacco: Never  Vaping Use   Vaping Use: Never used  Substance Use Topics   Alcohol use: Never   Drug use: Never    Home Medications Prior to Admission medications   Medication Sig Start Date End Date Taking? Authorizing Provider  cholecalciferol (VITAMIN D3) 25 MCG (1000 UT) tablet Take 1,000 Units by mouth daily.    [provider]  metFORMIN (GLUCOPHAGE) 850 MG tablet Take 1 tablet (850 mg total) by mouth 2 (two) times daily with a meal. 11/13/18   Mike Gip, FNP  Multiple Vitamins-Minerals (MULTIVITAMIN WITH MINERALS) tablet Take 1 tablet by mouth daily.    [provider]  triamcinolone  (KENALOG) 0.025 % ointment Apply 1 application topically 2 (two) times daily. 11/13/18   Mike Gip, FNP  vitamin C (VITAMIN C) 1000 MG tablet Take 0.5 tablets (500 mg total) by mouth daily. Take for 10 days 11/01/18   Elgergawy, Leana Roe, MD  zinc sulfate 220 (50 Zn) MG capsule Take 1 capsule (220 mg total) by mouth daily. Take for 10 days 11/01/18   Elgergawy, Leana Roe, MD    Allergies    Patient has no known allergies.  Review of Systems   Review of Systems  Skin:  Positive for rash and wound.   Physical Exam Updated Vital Signs BP 131/90   Pulse (!) 52   Temp 98.8 F (37.1 C) (Oral)   Resp 20   Ht 5\' 10"  (1.778 m)   Wt 90.7 kg   SpO2 99%   BMI 28.70 kg/m   Physical Exam Vitals and nursing note reviewed.  Constitutional:      Appearance: Normal appearance.  HENT:     Head: Normocephalic and atraumatic.     Right Ear: External ear normal.     Left Ear: External ear normal.     Nose: Nose normal.     Mouth/Throat:     Mouth: Mucous membranes are moist.     Pharynx: Oropharynx is clear.  Eyes:     Extraocular Movements: Extraocular movements intact.     Conjunctiva/sclera: Conjunctivae normal.     Pupils: Pupils are equal, round, and reactive to light.  Cardiovascular:     Rate and Rhythm: Normal rate and regular rhythm.     Pulses: Normal pulses.     Heart sounds: Normal heart sounds.  Pulmonary:     Effort: Pulmonary effort is normal.     Breath sounds: Normal breath sounds.  Abdominal:     General: Abdomen is flat. Bowel sounds are normal.     Palpations: Abdomen is soft.  Musculoskeletal:        General: Normal range of motion.     Cervical back: Normal range of motion and neck supple.  Skin:    General: Skin is warm.     Capillary Refill: Capillary refill takes less than 2 seconds.     Comments: Sore to bottom of left foot with cellulitis up thigh.  It does not involve the groin.  Neurological:     General: No focal deficit present.     Mental Status:  He is alert and oriented to person, place, and time.  Psychiatric:        Mood and Affect: Mood normal.        Behavior: Behavior normal.    ED Results / Procedures / Treatments   Labs (all labs ordered are listed, but only abnormal results are displayed) Labs Reviewed  COMPREHENSIVE METABOLIC PANEL - Abnormal; Notable for the following components:      Result Value   Glucose, Bld 136 (*)    Total Protein 8.4 (*)    ALT 47 (*)    All other components within normal limits  CBG MONITORING, ED - Abnormal; Notable for the following components:   Glucose-Capillary 138 (*)    All other components within normal limits  RESP PANEL BY RT-PCR (FLU A&B, COVID) ARPGX2  CULTURE, BLOOD (ROUTINE X 2)  CULTURE, BLOOD (ROUTINE X 2)  LACTIC ACID, PLASMA  LACTIC ACID, PLASMA  CBC WITH DIFFERENTIAL/PLATELET  PROTIME-INR  APTT  URINALYSIS, ROUTINE W REFLEX MICROSCOPIC  HEMOGLOBIN A1C    EKG EKG Interpretation  Date/Time:  Tuesday April 04 2021 16:45:37 EST Ventricular Rate:  54 PR Interval:  180 QRS Duration: 103 QT Interval:  424 QTC Calculation: 402 R Axis:   159 Text Interpretation: Sinus rhythm Left posterior fascicular block ST elev, probable normal early repol pattern No significant change since Confirmed by Jacalyn Lefevre [phone]) on 04/04/2021 5:06:52 PM  Radiology DG Foot Complete Left  Result Date: 04/04/2021 CLINICAL DATA:  Left foot wound. EXAM: LEFT FOOT - COMPLETE 3+ VIEW COMPARISON:  None. FINDINGS: There is no evidence of fracture or dislocation. There is no evidence of arthropathy or other focal bone abnormality. Soft tissues are unremarkable. IMPRESSION: Negative. Electronically Signed   By: Lupita Raider M.D.   On: 04/04/2021 15:31    Procedures Procedures   Medications Ordered in ED Medications  vancomycin (VANCOREADY) IVPB 1750 mg/350 mL (0 mg Intravenous Paused 04/04/21 1714)  sodium chloride 0.9 % bolus 1,000 mL (1,000 mLs Intravenous New Bag/Given 04/04/21  1655)  ceFAZolin (ANCEF) IVPB 1 g/50 mL premix (1 g Intravenous New Bag/Given 04/04/21 1709)    ED Course  I have reviewed the triage vital signs and the nursing notes.  Pertinent labs & imaging results that were available during my care of the patient were reviewed by me and considered in my medical decision making (see  chart for details).    MDM Rules/Calculators/A&P                           Pt has rapidly spreading cellulitis.  Pt given vancomycin and ancef in the ED.  Pt does not appear to be septic.  Pt d/w Dr. Margo Aye (triad) for admission.  Final Clinical Impression(s) / ED Diagnoses Final diagnoses:  Cellulitis of left lower extremity  Nonhealing nonsurgical wound    Rx / DC Orders ED Discharge Orders     None        Jacalyn Lefevre, MD 04/04/21 1810

## 2021-04-04 NOTE — H&P (Signed)
History and Physical  Satish Hammers GLO:[id number] DOB: 03-21-1976 DOA: 04/04/2021  Referring physician: Dr. Particia Nearing, EDP PCP: Pcp, No  Outpatient Specialists: None Patient coming from: Home  Chief Complaint: Left thigh redness, swelling and pain  HPI: Scott Nicholson is a 45 y.o. male with medical history significant for type 2 diabetes, hypertension, COVID-19 pneumonia in 2020(hospitalized for 7 days), who presented to [facility] ED due to sudden onset left thigh swelling, redness, and pain x1 day associated with intermittent subjective fevers and chills for the past 5 days.  Endorses about a week ago he noticed a lesion in the plantar region of his left foot.  He thinks it may have originated from wearing wet boots 3 days prior.  The lesion has worsened and he feels it may have traveled to his left thigh.  He tried to treat his left foot wound with an over-the-counter topical agent without improvement.  He does not have a primary care provider due to lack of insurance.  Due to worsening pain, swelling and redness in his left thigh, he decided to present to the ED for further evaluation and management.  Denies use of tobacco or alcohol.  Work-up in the ED revealed left lower extremity cellulitis.  Admitted by hospitalist service.  ED Course: Temperature 98.8.  BP 145/121, pulse 62, respiration rate 18 constitutional percent on room air.  COVID-19 screening test negative.  Review of Systems: Review of systems as noted in the HPI. All other systems reviewed and are negative.   Past Medical History:  Diagnosis Date   Acute respiratory disease due to COVID-19 virus 10/25/2018   Diabetes mellitus without complication (HCC)    Pneumonia    History reviewed. No pertinent surgical history.  Social History:  reports that he has never smoked. He has never used smokeless tobacco. He reports that he does not drink alcohol and does not use drugs.   No Known Allergies  Family History  Problem Relation  Age of Onset   Diabetes Mellitus II Mother    Hypertension Mother       Prior to Admission medications   Medication Sig Start Date End Date Taking? Authorizing Provider  Multiple Vitamins-Minerals (MULTIVITAMIN WITH MINERALS) tablet Take 1 tablet by mouth daily.   Yes [provider]  vitamin C (VITAMIN C) 1000 MG tablet Take 0.5 tablets (500 mg total) by mouth daily. Take for 10 days 11/01/18  Yes Elgergawy, Leana Roe, MD  zinc sulfate 220 (50 Zn) MG capsule Take 1 capsule (220 mg total) by mouth daily. Take for 10 days 11/01/18  Yes Elgergawy, Leana Roe, MD  metFORMIN (GLUCOPHAGE) 850 MG tablet Take 1 tablet (850 mg total) by mouth 2 (two) times daily with a meal. Patient not taking: Reported on 04/04/2021 11/13/18   Mike Gip, FNP  triamcinolone (KENALOG) 0.025 % ointment Apply 1 application topically 2 (two) times daily. Patient not taking: Reported on 04/04/2021 11/13/18   Mike Gip, FNP    Physical Exam: BP 131/90   Pulse (!) 52   Temp 98.8 F (37.1 C) (Oral)   Resp 20   Ht 5\' 10"  (1.778 m)   Wt 90.7 kg   SpO2 99%   BMI 28.70 kg/m   General: 45 y.o. year-old male well developed well nourished in no acute distress.  Alert and oriented x3. Cardiovascular: Regular rate and rhythm with no rubs or gallops.  No thyromegaly or JVD noted.  No lower extremity edema. 2/4 pulses in all 4 extremities. Respiratory: Clear to  auscultation with no wheezes or rales. Good inspiratory effort. Abdomen: Soft nontender nondistended with normal bowel sounds x4 quadrants. Muskuloskeletal: No cyanosis, clubbing or edema noted bilaterally Neuro: CN II-XII intact, strength, sensation, reflexes Skin:    Psychiatry: Judgement and insight appear normal. Mood is appropriate for condition and setting          Labs on Admission:  Basic Metabolic Panel: Recent Labs  Lab 04/04/21 1539  NA 139  K 4.2  CL 104  CO2 27  GLUCOSE 136*  BUN 19  CREATININE 1.05  CALCIUM 9.3   Liver  Function Tests: Recent Labs  Lab 04/04/21 1539  AST 41  ALT 47*  ALKPHOS 67  BILITOT 0.6  PROT 8.4*  ALBUMIN 4.3   No results for input(s): LIPASE, AMYLASE in the last 168 hours. No results for input(s): AMMONIA in the last 168 hours. CBC: Recent Labs  Lab 04/04/21 1539  WBC 8.5  NEUTROABS 5.3  HGB 15.1  HCT 45.0  MCV 88.8  PLT 242   Cardiac Enzymes: No results for input(s): CKTOTAL, CKMB, CKMBINDEX, TROPONINI in the last 168 hours.  BNP (last 3 results) No results for input(s): BNP in the last 8760 hours.  ProBNP (last 3 results) No results for input(s): PROBNP in the last 8760 hours.  CBG: Recent Labs  Lab 04/04/21 1500  GLUCAP 138*    Radiological Exams on Admission: DG Foot Complete Left  Result Date: 04/04/2021 CLINICAL DATA:  Left foot wound. EXAM: LEFT FOOT - COMPLETE 3+ VIEW COMPARISON:  None. FINDINGS: There is no evidence of fracture or dislocation. There is no evidence of arthropathy or other focal bone abnormality. Soft tissues are unremarkable. IMPRESSION: Negative. Electronically Signed   By: Marijo Conception M.D.   On: 04/04/2021 15:31    EKG: I independently viewed the EKG done and my findings are as followed: Sinus rhythm 54.  Nonspecific ST-T changes.  QTc 402.  Assessment/Plan Present on Admission:  Cellulitis  Principal Problem:   Cellulitis  Left lower extremity cellulitis, POA Follow blood cultures x2 Obtain MRSA screen IV antibiotics empirically, Ancef IV pain medications IV fluid Monitor fever curve and WBC Repeat CBC in the morning  Left foot wound, POA X-ray left foot negative. Wound care specialist consulted Local wound care with wound care specialist guidance  Type 2 diabetes with hyperglycemia Not on oral hypoglycemics due to lack of insurance Obtain hemoglobin A1c Start insulin sliding scale. TOC to assist with medication and PCP referral Diabetes coordinator for patient's education on type 2 diabetes  Essential  hypertension Not oral antihypertensive, due to lack of insurance Start hydralazine due to sinus bradycardia  Sinus bradycardia Heart rate 48 from 52 Obtain TSH Monitor closely  CKD 2 Creatinine 1.05, GFR greater than 60 Urine analysis negative for proteinuria. Avoid nephrotoxic agents IV fluid hydration Monitor urine output Repeat BMP in the morning   DVT prophylaxis: Subcu Lovenox daily  Code Status: Full code  Family Communication: None at bedside  Disposition Plan: Admitted to [facility] unit  Consults called: None  Admission status: Inpatient status.   Status is: Inpatient  Patient will require at least 2 midnights for further evaluation and treatment of present condition.     Kayleen Memos MD [facility] Pager [phone]  If 7PM-7AM, please contact night-coverage [email or web address] Password [facility]  04/04/2021, 6:48 PM

## 2021-04-04 NOTE — ED Notes (Signed)
Unable to get labs at this time patient is in xray

## 2021-04-04 NOTE — ED Provider Notes (Signed)
Emergency Medicine Provider Triage Evaluation Note  Scott Nicholson , a 45 y.o. male  was evaluated in triage.  Pt complains of a blister on the bottom of the foot about 5 to 7 days ago.  The wound popped.  He has been treating it at home with gentamicin violet without improvement.  He has had fevers over the weekend per him and his wife. He was diagnosed with diabetes when he had COVID and has not had insurance so he has not had any treatment since. He developed a painful, burning feeling red area over the left proximal thigh that he first noticed yesterday.  He denies any extension of the redness or pain into his genitals.  He has never had similar wounds before.  Review of Systems  Positive: Redness, wounds, fevers Negative: Syncope, chest pain  Physical Exam  BP (!) 149/87 (BP Location: Left Arm)   Pulse 70   Temp 98.8 F (37.1 C) (Oral)   Resp 18   Ht 5\' 10"  (1.778 m)   Wt 90.7 kg   SpO2 99%   BMI 28.70 kg/m  Gen:   Awake, no distress   Resp:  Normal effort  MSK:   Moves extremities without difficulty  Other:  Chaperone in room.  There is a large area of erythema over the left proximal thigh medially spanning almost the entire thigh.  The area of erythema and induration stops distal to the inguinal crease and genitals.  There is no palpable fluctuance. There is a approximately 2 cm wound on the dorsum of the left foot over the distal metatarsals which is tender to the touch without active drainage.  Medical Decision Making  Medically screening exam initiated at 3:07 PM.  Appropriate orders placed.  Scott Nicholson was informed that the remainder of the evaluation will be completed by another provider, this initial triage assessment does not replace that evaluation, and the importance of remaining in the ED until their evaluation is complete.  Patient presents today for evaluation of about 5 to 7 days of a wound on the left foot that since yesterday has now evolved into a large  erythematous area on the left medial thigh. I am concerned that he may have spread of infection from his foot wound. He reports fevers over the weekend.  He is afebrile here and not tachycardic or tachypneic, however given the extent of the erythema on the leg I am concerned that he may ultimately require admission for IV antibiotics.  Will obtain labs, blood cultures.  He is not clearly septic at this time. Will obtain x-rays of the foot wound.  Note: Portions of this report may have been transcribed using voice recognition software. Every effort was made to ensure accuracy; however, inadvertent computerized transcription errors may be present    Karrie Doffing, PA-C 04/04/21 1511    14/06/22, DO 04/05/21 [phone]

## 2021-04-05 DIAGNOSIS — L03116 Cellulitis of left lower limb: Secondary | ICD-10-CM | POA: Diagnosis present

## 2021-04-05 LAB — COMPREHENSIVE METABOLIC PANEL
ALT: 48 U/L — ABNORMAL HIGH (ref 0–44)
AST: 37 U/L (ref 15–41)
Albumin: 3.7 g/dL (ref 3.5–5.0)
Alkaline Phosphatase: 52 U/L (ref 38–126)
Anion gap: 7 (ref 5–15)
BUN: 17 mg/dL (ref 6–20)
CO2: 23 mmol/L (ref 22–32)
Calcium: 8.9 mg/dL (ref 8.9–10.3)
Chloride: 107 mmol/L (ref 98–111)
Creatinine, Ser: 0.86 mg/dL (ref 0.61–1.24)
GFR, Estimated: >60 mL/min (ref >60)
Glucose, Bld: 118 mg/dL — ABNORMAL HIGH (ref 70–99)
Potassium: 3.9 mmol/L (ref 3.5–5.1)
Sodium: 137 mmol/L (ref 135–145)
Total Bilirubin: 0.9 mg/dL (ref 0.3–1.2)
Total Protein: 7.5 g/dL (ref 6.5–8.1)

## 2021-04-05 LAB — CBC
HCT: 43.4 % (ref 39.0–52.0)
Hemoglobin: 14.6 g/dL (ref 13.0–17.0)
MCH: 30 pg (ref 26.0–34.0)
MCHC: 33.6 g/dL (ref 30.0–36.0)
MCV: 89.1 fL (ref 80.0–100.0)
Platelets: 237 10*3/uL (ref 150–400)
RBC: 4.87 MIL/uL (ref 4.22–5.81)
RDW: 12.3 % (ref 11.5–15.5)
WBC: 7.5 10*3/uL (ref 4.0–10.5)
nRBC: 0 % (ref 0.0–0.2)

## 2021-04-05 LAB — TSH: TSH: 2.981 u[IU]/mL (ref 0.350–4.500)

## 2021-04-05 LAB — MRSA NEXT GEN BY PCR, NASAL: MRSA by PCR Next Gen: NOT DETECTED

## 2021-04-05 LAB — CBG MONITORING, ED
Glucose-Capillary: 98 mg/dL (ref 70–99)
Glucose-Capillary: 102 mg/dL — ABNORMAL HIGH (ref 70–99)
Glucose-Capillary: 103 mg/dL — ABNORMAL HIGH (ref 70–99)
Glucose-Capillary: 121 mg/dL — ABNORMAL HIGH (ref 70–99)

## 2021-04-05 LAB — PHOSPHORUS: Phosphorus: 4 mg/dL (ref 2.5–4.6)

## 2021-04-05 LAB — HIV ANTIBODY (ROUTINE TESTING W REFLEX): HIV Screen 4th Generation wRfx: NONREACTIVE

## 2021-04-05 LAB — MAGNESIUM: Magnesium: 2.1 mg/dL (ref 1.7–2.4)

## 2021-04-05 LAB — GLUCOSE, CAPILLARY: Glucose-Capillary: 129 mg/dL — ABNORMAL HIGH (ref 70–99)

## 2021-04-05 MED ORDER — SILVER SULFADIAZINE 1 % EX CREA
TOPICAL_CREAM | Freq: Every day | CUTANEOUS | Status: DC
  Filled 2021-04-05 (×2): qty 50

## 2021-04-05 NOTE — Consult Note (Addendum)
WOC Nurse Consult Note: Reason for Consult: Consult requested for left foot.   Performed remotely after review of progress notes and photo in the EMR.  Left plantar foot with full thickness wound which began as a blister, according to patient, and has ruptured and evolved into full thickness skin loss.  Wound bed is 50% black, with 50% black/purple edges from previous topical treatment pt has been using. Pt has cellulitis with generalized edema and erythremia to left thigh and is being treated with systemic antibiotics.  Dressing procedure/placement/frequency: Topical treatment orders provided for bedside nurses to perform as follows to promote moist healing: Apply Silvadene to left foot wound Q day, then cover with gauze and tape.  Wash with soap and water each time to remove previous cream.  Please re-consult if further assistance is needed.  Thank-you,  Cammie Mcgee MSN, RN, CWOCN, [place], CNS [phone]

## 2021-04-05 NOTE — TOC Initial Note (Signed)
Transition of Care [facility]) - Initial/Assessment Note    Patient Details  Name: Scott Nicholson MRN: [id number] Date of Birth: March 18, 1976  [facility] CM/SW Contact:    Joanne Chars, LCSW Phone Number: 04/05/2021, 6:21 PM  Clinical Narrative:     CSW met with pt and wife Scott Nicholson to complete initial assessment.  Permission given to speak with wife present.  Pt is uninsured, from [place].  No PCP, has put in application for PCP at [facility] in [place].  Does want PCP, willing to come to provider in [place] if need be.  Pt is vaccinated for covid with one booster.  Informed pt that [facility] staff on floor will meet with him for any DC needs.               Expected Discharge Plan:  (TBD) Barriers to Discharge: Continued Medical Work up   Patient Goals and CMS Choice Patient states their goals for this hospitalization and ongoing recovery are:: feel better      Expected Discharge Plan and Services Expected Discharge Plan:  (TBD) In-house Referral: Clinical Social Work   Post Acute Care Choice:  (TBD) Living arrangements for the past 2 months: Single Family Home                                      Prior Living Arrangements/Services Living arrangements for the past 2 months: Single Family Home Lives with:: Minor Children, Spouse Patient language and need for interpreter reviewed:: Yes Do you feel safe going back to the place where you live?: Yes      Need for Family Participation in Patient Care: No (Comment) Care giver support system in place?: Yes (comment) Current home services: Other (comment) (none) Criminal Activity/Legal Involvement Pertinent to Current Situation/Hospitalization: No - Comment as needed  Activities of Daily Living Home Assistive Devices/Equipment: None ADL Screening (condition at time of admission) Patient's cognitive ability adequate to safely complete daily activities?: Yes Is the patient deaf or have difficulty hearing?: No Does  the patient have difficulty seeing, even when wearing glasses/contacts?: No Does the patient have difficulty concentrating, remembering, or making decisions?: No Patient able to express need for assistance with ADLs?: Yes Does the patient have difficulty dressing or bathing?: No Independently performs ADLs?: Yes (appropriate for developmental age) Does the patient have difficulty walking or climbing stairs?: Yes Weakness of Legs: None Weakness of Arms/Hands: None  Permission Sought/Granted Permission sought to share information with : Family Supports Permission granted to share information with : Yes, Verbal Permission Granted  Share Information with NAME: wife Scott Nicholson           Emotional Assessment Appearance:: Appears stated age Attitude/Demeanor/Rapport: Engaged Affect (typically observed): Appropriate, Pleasant Orientation: :  (not recorded, appears oriented) Alcohol / Substance Use: Not Applicable Psych Involvement: No (comment)  Admission diagnosis:  Cellulitis [L03.90] Patient Active Problem List   Diagnosis Date Noted   Cellulitis 04/04/2021   History of 2019 novel coronavirus disease (COVID-19) 12/22/2018   Sepsis ([place]) 10/25/2018   Abnormal LFTs 10/25/2018   Thrombocytopenia ([place]) 10/25/2018   PCP:  Pcp, No Pharmacy:   [facility] [place] [facility] [place] [address] Phone: [phone] Fax: [phone]     Social Determinants of Health (SDOH) Interventions    Readmission Risk Interventions No flowsheet data found.

## 2021-04-05 NOTE — Progress Notes (Signed)
PROGRESS NOTE    Scott Nicholson  [id number] DOB: 1975/11/29 DOA: 04/04/2021 PCP: Pcp, No    Brief Narrative:  This 45 years old male with PMH significant for type 2 diabetes, hypertension, COVID-19 pneumonia in 2020(hospitalized for 7 days), presented in the ED with sudden onset of left thigh swelling, redness and pain for 1 day associated with subjective fever and chills for past 5 days.  Patient reports he noticed a lesion in the plantar region of his left foot and thinks it might have originated from wearing wet boots 3 days prior.  He tried to treat his left foot with over-the-counter topical agent without improvement.  He does not have a primary care physician due to lack of insurance. Patient is admitted in the hospital for left thigh cellulitis with open wound in the left bottom of foot.  Patient is started on IV antibiotic   Assessment & Plan:   Principal Problem:   Cellulitis  Left lower extremity cellulitis POA: Patient presented with redness, swelling and pain in the left thigh. It seems like cellulitis.  Continue empiric IV antibiotics Ancef. Follow blood cultures. Adequate pain control.  Continue IV hydration.  Left foot wound POA: X-ray left foot negative for abnormality. Wound care consulted follow-up recommendation Local wound care silver sulfadiazine.  Type 2 diabetes with hyperglycemia: Not on oral hypoglycemic due to lack of insurance. Hemoglobin A1c 6.1. Continue regular insulin sliding scale.  Diabetic coordinator consult   Essential hypertension: Not on any oral hypertensives due to lack of insurance. Continue hydralazine as needed.  Sinus bradycardia: Heart rate ranges between 48-52 TSH normal.   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   Consultants:   None  Procedures: None  Antimicrobials:   Anti-infectives (From admission, onward)    Start     Dose/Rate Route Frequency Ordered Stop   04/05/21 0100  ceFAZolin (ANCEF) IVPB 1 g/50 mL premix        1 g 100 mL/hr over 30 Minutes Intravenous Every 8 hours 04/04/21 1847 04/11/21 2359   04/04/21 1645  ceFAZolin (ANCEF) IVPB 1 g/50 mL premix        1 g 100 mL/hr over 30 Minutes Intravenous  Once 04/04/21 1637 04/04/21 1911   04/04/21 1645  vancomycin (VANCOREADY) IVPB 1750 mg/350 mL        1,750 mg 175 mL/hr over 120 Minutes Intravenous  Once 04/04/21 1640 04/04/21 1910        Subjective: Patient was seen and examined at bedside.  Overnight events noted.   Patient reports still having pain in the left thigh and in the left bottom of the foot.  Objective: Vitals:   04/05/21 1200 04/05/21 1230 04/05/21 1300 04/05/21 1400  BP: 123/80  (!) 148/92 130/89  Pulse: (!) 45 75  (!) 58  Resp: 14 12 16 17   Temp:      TempSrc:      SpO2: 98% 99% 99% 100%  Weight:      Height:        Intake/Output Summary (Last 24 hours) at 04/05/2021 1540 Last data filed at 04/05/2021 0806 Gross per 24 hour  Intake 102.5 ml  Output 500 ml  Net -397.5 ml   Filed Weights   04/04/21 1446  Weight: 90.7 kg    Examination:  General exam: Appears comfortable, not in any acute distress. Respiratory system: Clear to auscultation. Respiratory effort normal.  RR 15 Cardiovascular system: S1 S2 heard, regular rate and rhythm, no murmur. Gastrointestinal system: Abdomen is soft, nontender, nondistended, BS +. Central nervous system: Alert and oriented x3 . No focal neurological deficits. Extremities: Left thigh erythematous, warm, tender.  There is a blistering wound noted in the bottom of left foot, mild tender. Skin: No rashes, lesions or ulcers Psychiatry: Judgement and insight appear normal.  Mood & affect appropriate.     Data Reviewed: I have personally reviewed following labs and imaging studies  CBC: Recent Labs  Lab 04/04/21 1539 04/05/21 0500  WBC 8.5 7.5  NEUTROABS 5.3  --   HGB 15.1 14.6  HCT 45.0 43.4  MCV 88.8 89.1  PLT 242 [id number]   Basic Metabolic Panel: Recent Labs  Lab 04/04/21 1539 04/05/21 0500  NA 139 137  K 4.2 3.9  CL 104 107  CO2 27 23  GLUCOSE 136* 118*  BUN 19 17  CREATININE 1.05 0.86  CALCIUM 9.3 8.9  MG  --  2.1  PHOS  --  4.0   GFR: Estimated Creatinine Clearance: 122.9 mL/min (by C-G formula based on SCr of 0.86 mg/dL). Liver Function Tests: Recent Labs  Lab 04/04/21 1539 04/05/21 0500  AST 41 37  ALT 47* 48*  ALKPHOS 67 52  BILITOT 0.6 0.9  PROT 8.4* 7.5  ALBUMIN 4.3 3.7   No results for input(s): LIPASE, AMYLASE in the last 168 hours. No results for input(s): AMMONIA in the last 168 hours. Coagulation Profile: Recent Labs  Lab 04/04/21 1539  INR 1.0   Cardiac Enzymes: No results for input(s): CKTOTAL, CKMB, CKMBINDEX, TROPONINI in the last 168 hours. BNP (last 3 results) No results for input(s): PROBNP in the last 8760 hours. HbA1C: Recent Labs    04/04/21 1542  HGBA1C 6.1*   CBG: Recent Labs  Lab 04/04/21 1500 04/04/21 2141 04/05/21 0744 04/05/21 1224  GLUCAP 138* 118* 98 102*   Lipid Profile: No results for input(s): CHOL, HDL, LDLCALC, TRIG, CHOLHDL, LDLDIRECT in the last 72 hours. Thyroid Function Tests: Recent Labs    04/05/21 0500  TSH 2.981   Anemia Panel: No results for input(s): VITAMINB12, FOLATE, FERRITIN, TIBC, IRON, RETICCTPCT in the last 72 hours. Sepsis Labs: Recent Labs  Lab 04/04/21 1539 04/04/21 1657  LATICACIDVEN 1.2 1.5    Recent Results (from the past 240 hour(s))  Blood Culture (routine x 2)     Status: None (Preliminary result)   Collection Time: 04/04/21  3:40 PM   Specimen: BLOOD  Result Value Ref Range Status   Specimen Description   Final    BLOOD LEFT  ANTECUBITAL Performed at [facility] [address], [place], [place] [address]    Special Requests   Final    BOTTLES DRAWN AEROBIC AND ANAEROBIC Blood Culture adequate volume Performed at [facility] [address], [place], [place] [address]    Culture   Final    NO GROWTH < 24 HOURS Performed at [facility] [address], [place], [place] [address]    Report Status PENDING  Incomplete  Resp Panel by RT-PCR (Flu A&B, Covid) Nasopharyngeal Swab     Status: None   Collection Time: 04/04/21  3:43 PM   Specimen: Nasopharyngeal Swab; Nasopharyngeal(NP) swabs in vial transport medium  Result Value Ref Range Status  SARS Coronavirus 2 by RT PCR NEGATIVE NEGATIVE Final    Comment: (NOTE) SARS-CoV-2 target nucleic acids are NOT DETECTED.  The SARS-CoV-2 RNA is generally detectable in upper respiratory specimens during the acute phase of infection. The lowest concentration of SARS-CoV-2 viral copies this assay can detect is 138 copies/mL. A negative result does not preclude SARS-Cov-2 infection and should not be used as the sole basis for treatment or other patient management decisions. A negative result may occur with  improper specimen collection/handling, submission of specimen other than nasopharyngeal swab, presence of viral mutation(s) within the areas targeted by this assay, and inadequate number of viral copies(<138 copies/mL). A negative result must be combined with clinical observations, patient history, and epidemiological information. The expected result is Negative.  Fact Sheet for Patients:  EntrepreneurPulse.com.au  Fact Sheet for Healthcare Providers:  IncredibleEmployment.be  This test is no t yet approved or cleared by the [place] FDA and  has been authorized for detection and/or diagnosis of SARS-CoV-2 by FDA under an Emergency Use Authorization (EUA). This EUA will remain   in effect (meaning this test can be used) for the duration of the COVID-19 declaration under Section 564(b)(1) of the Act, 21 U.S.C.section 360bbb-3(b)(1), unless the authorization is terminated  or revoked sooner.       Influenza A by PCR NEGATIVE NEGATIVE Final   Influenza B by PCR NEGATIVE NEGATIVE Final    Comment: (NOTE) The Xpert Xpress SARS-CoV-2/FLU/RSV plus assay is intended as an aid in the diagnosis of influenza from Nasopharyngeal swab specimens and should not be used as a sole basis for treatment. Nasal washings and aspirates are unacceptable for Xpert Xpress SARS-CoV-2/FLU/RSV testing.  Fact Sheet for Patients: EntrepreneurPulse.com.au  Fact Sheet for Healthcare Providers: IncredibleEmployment.be  This test is not yet approved or cleared by the [place] FDA and has been authorized for detection and/or diagnosis of SARS-CoV-2 by FDA under an Emergency Use Authorization (EUA). This EUA will remain in effect (meaning this test can be used) for the duration of the COVID-19 declaration under Section 564(b)(1) of the Act, 21 U.S.C. section 360bbb-3(b)(1), unless the authorization is terminated or revoked.  Performed at [facility], [place] [address], [place], [place] [address]   Blood Culture (routine x 2)     Status: None (Preliminary result)   Collection Time: 04/04/21  4:57 PM   Specimen: Site Not Specified; Blood  Result Value Ref Range Status   Specimen Description   Final    SITE NOT SPECIFIED Performed at [facility], [place] [address] [place], [place] [address]    Special Requests   Final    BOTTLES DRAWN AEROBIC AND ANAEROBIC Blood Culture results may not be optimal due to an inadequate volume of blood received in culture bottles Performed at [facility] [address] [place], [place] [address]    Culture   Final    NO GROWTH < 24 HOURS Performed at [facility] [address], [place], [place] [address]    Report Status PENDING  Incomplete  MRSA Next Gen by PCR, Nasal     Status: None   Collection Time: 04/05/21 12:29 AM   Specimen: Nasal Mucosa; Nasal Swab  Result Value Ref Range Status   MRSA by PCR Next Gen NOT DETECTED NOT DETECTED Final    Comment: (NOTE) The GeneXpert MRSA Assay (FDA approved for NASAL specimens only), is one component of a comprehensive MRSA colonization surveillance program. It is  not intended to diagnose MRSA infection nor to guide or monitor treatment for MRSA infections. Test performance is not FDA approved in patients less than 59 years old. Performed at [facility], [address], [place], [place] [address]     Radiology Studies: DG Foot Complete Left  Result Date: 04/04/2021 CLINICAL DATA:  Left foot wound. EXAM: LEFT FOOT - COMPLETE 3+ VIEW COMPARISON:  None. FINDINGS: There is no evidence of fracture or dislocation. There is no evidence of arthropathy or other focal bone abnormality. Soft tissues are unremarkable. IMPRESSION: Negative. Electronically Signed   By: Lupita Raider M.D.   On: 04/04/2021 15:31    Scheduled Meds:  enoxaparin (LOVENOX) injection  40 mg Subcutaneous Q24H   hydrALAZINE  10 mg Oral BID   insulin aspart  0-5 Units Subcutaneous QHS   insulin aspart  0-9 Units Subcutaneous TID WC   senna-docusate  2 tablet Oral QHS   silver sulfADIAZINE   Topical Daily   Continuous Infusions:   ceFAZolin (ANCEF) IV Stopped (04/05/21 0806)   lactated ringers 50 mL/hr at 04/05/21 1506     LOS: 1 day    Time spent: 35 mins    Corvette Orser, MD [facility]   If 7PM-7AM, please contact night-coverage progre

## 2021-04-05 NOTE — Progress Notes (Signed)
Inpatient Diabetes Program Recommendations  AACE/ADA: New Consensus Statement on Inpatient Glycemic Control (2015)  Target Ranges:  Prepandial:   less than 140 mg/dL      Peak postprandial:   less than 180 mg/dL (1-2 hours)      Critically ill patients:  140 - 180 mg/dL   Lab Results  Component Value Date   GLUCAP 102 (H) 04/05/2021   HGBA1C 6.1 (H) 04/04/2021    Review of Glycemic Control  Latest Reference Range & Units 04/04/21 15:00 04/04/21 21:41 04/05/21 07:44 04/05/21 12:24  Glucose-Capillary 70 - 99 mg/dL 829 (H) 937 (H) 98 169 (H)   Diabetes history: DM 2 Outpatient Diabetes medications: None, Metformin in the past immediately after steroid treatment for COVID Current orders for Inpatient glycemic control:  Novolog 0-9 units tid + hs  Consult:  Patient education on type 2 diabetes  A1c 6.1% on 12/6 (pre diabetes level) A1c obtained 3 weeks after 7 day hospitalization with COVID with steroid treatment  Note: Pt had MD appt after hospitalization and metformin was not continued due to normalization of glucose levels.  Spoke with pt at bedside and wife on speaker phone. Based on glucose trends here, not requiring insulin, A1c level and the fact that pt had recent hospitalization with COVID for 7 days with steroid treatment. The A1c is a product of steroid therapy. Pt dose have DM in the family.  Recommend pt get A1c rechecked in 3-4 months. Pt does not need metformin at this time.monitor glucose trends for now.  Thanks,  Christena Deem RN, MSN, BC-ADM Inpatient Diabetes Coordinator Team Pager [phone] (8a-5p)

## 2021-04-06 LAB — BASIC METABOLIC PANEL
Anion gap: 8 (ref 5–15)
BUN: 18 mg/dL (ref 6–20)
CO2: 26 mmol/L (ref 22–32)
Calcium: 9 mg/dL (ref 8.9–10.3)
Chloride: 103 mmol/L (ref 98–111)
Creatinine, Ser: 0.97 mg/dL (ref 0.61–1.24)
GFR, Estimated: >60 mL/min (ref >60)
Glucose, Bld: 108 mg/dL — ABNORMAL HIGH (ref 70–99)
Potassium: 3.9 mmol/L (ref 3.5–5.1)
Sodium: 137 mmol/L (ref 135–145)

## 2021-04-06 LAB — GLUCOSE, CAPILLARY
Glucose-Capillary: 138 mg/dL — ABNORMAL HIGH (ref 70–99)
Glucose-Capillary: 116 mg/dL — ABNORMAL HIGH (ref 70–99)
Glucose-Capillary: 123 mg/dL — ABNORMAL HIGH (ref 70–99)
Glucose-Capillary: 103 mg/dL — ABNORMAL HIGH (ref 70–99)

## 2021-04-06 LAB — CBC
HCT: 43.2 % (ref 39.0–52.0)
Hemoglobin: 14.7 g/dL (ref 13.0–17.0)
MCH: 30.1 pg (ref 26.0–34.0)
MCHC: 34 g/dL (ref 30.0–36.0)
MCV: 88.5 fL (ref 80.0–100.0)
Platelets: 241 10*3/uL (ref 150–400)
RBC: 4.88 MIL/uL (ref 4.22–5.81)
RDW: 12.3 % (ref 11.5–15.5)
WBC: 8 10*3/uL (ref 4.0–10.5)
nRBC: 0 % (ref 0.0–0.2)

## 2021-04-06 LAB — PHOSPHORUS: Phosphorus: 5.4 mg/dL — ABNORMAL HIGH (ref 2.5–4.6)

## 2021-04-06 LAB — MAGNESIUM: Magnesium: 2.4 mg/dL (ref 1.7–2.4)

## 2021-04-06 NOTE — Progress Notes (Signed)
Please call wife, Danarius Mcconathy, with update after morning rounds. [phone]

## 2021-04-06 NOTE — Progress Notes (Addendum)
PROGRESS NOTE    Scott Nicholson  [id number] DOB: [birth year] DOA: 04/04/2021 PCP: Pcp, No    Brief Narrative:  This 45 years old male with PMH significant for type 2 diabetes, hypertension, COVID-19 pneumonia in 2020 (hospitalized for 7 days), presented in the ED with sudden onset of left thigh swelling, redness and pain for 1 day associated with subjective fever and chills for past 5 days.  Patient reports he noticed a lesion in the plantar region of his left foot and thinks it might have originated from wearing wet boots 3 days prior.  He tried to treat his left foot with over-the-counter topical agent without improvement. He does not have a primary care physician due to lack of insurance. Patient is admitted in the hospital for left thigh cellulitis with open wound in the left bottom of foot. Patient is started on IV antibiotics.  Assessment & Plan:   Principal Problem:   Cellulitis Active Problems:   Cellulitis of left leg  Left lower extremity cellulitis POA: Patient presented with redness, swelling and pain in the left thigh. It seems like cellulitis. Continue empiric IV antibiotics Ancef. Blood cultures no growth so far. Adequate pain control.  Continue IV hydration.  Left foot wound POA: X-ray left foot negative for abnormality. Wound care consulted follow-up recommendation Local wound care with silver sulfadiazine.  Type 2 diabetes with hyperglycemia: Not on oral hypoglycemic due to lack of insurance. Hemoglobin A1c 6.1. Continue regular insulin sliding scale.   Diabetic coordinator consult. Patient needs outpatient follow-up with PCP to recheck A1c.  Essential hypertension: Not on any oral hypertensives due to lack of insurance. Continue hydralazine as needed.  Sinus bradycardia: Heart rate ranges between 48-52 TSH normal.  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 Avoid nephrotoxic medication, gentle IV hydration   DVT prophylaxis: Lovenox Code  Status: Full code Family Communication: Wife at bedside Disposition Plan:   Status is: Inpatient  Remains inpatient appropriate because: Admitted for left thigh cellulitis requiring IV antibiotics.   Consultants:   None  Procedures: None  Antimicrobials:   Anti-infectives (From admission, onward)    Start     Dose/Rate Route Frequency Ordered Stop   04/05/21 0100  ceFAZolin (ANCEF) IVPB 1 g/50 mL premix        1 g 100 mL/hr over 30 Minutes Intravenous Every 8 hours 04/04/21 1847 04/11/21 2359   04/04/21 1645  ceFAZolin (ANCEF) IVPB 1 g/50 mL premix        1 g 100 mL/hr over 30 Minutes Intravenous  Once 04/04/21 1637 04/04/21 1911   04/04/21 1645  vancomycin (VANCOREADY) IVPB 1750 mg/350 mL        1,750 mg 175 mL/hr over 120 Minutes Intravenous  Once 04/04/21 1640 04/04/21 1910        Subjective: Patient was seen and examined at bedside.  Overnight events noted.   Patient reports left thigh pain has improved. Redness and swelling has also improved.  Objective: Vitals:   04/06/21 0001 04/06/21 0723 04/06/21 1007 04/06/21 1019  BP:  134/83 (!) 162/101 140/86  Pulse:  (!) 49 90   Resp:  16 18   Temp:  98 F (36.7 C) 99 F (37.2 C)   TempSrc:  Oral Oral   SpO2:  98% 97%   Weight: 87.4 kg     Height: 5\' 10"  (1.778 m)       Intake/Output Summary (Last 24 hours) at 04/06/2021 1216 Last data filed at 04/06/2021 0833 Gross per 24 hour  Intake 1699.17 ml  Output --  Net 1699.17 ml   Filed Weights   04/04/21 1446 04/06/21 0001  Weight: 90.7 kg 87.4 kg    Examination:  General exam: Appears comfortable, not in any acute distress. Respiratory system: Clear to auscultation. Respiratory effort normal.  RR 15 Cardiovascular system: S1 S2 heard, regular rate and rhythm, no murmur. Gastrointestinal system: Abdomen is soft, nontender, nondistended, BS +. Central nervous system: Alert and oriented x3 . No focal neurological deficits. Extremities: Left thigh erythema has  improved, tenderness, warmth +, there is a blistering wound noted in the bottom of left foot, mild tender.  Dressing noted. Skin: No rashes, lesions or ulcers Psychiatry: Judgement and insight appear normal. Mood & affect appropriate.     Data Reviewed: I have personally reviewed following labs and imaging studies  CBC: Recent Labs  Lab 04/04/21 1539 04/05/21 0500 04/06/21 0358  WBC 8.5 7.5 8.0  NEUTROABS 5.3  --   --   HGB 15.1 14.6 14.7  HCT 45.0 43.4 43.2  MCV 88.8 89.1 88.5  PLT 242 237 241   Basic Metabolic Panel: Recent Labs  Lab 04/04/21 1539 04/05/21 0500 04/06/21 0358  NA 139 137 137  K 4.2 3.9 3.9  CL 104 107 103  CO2 27 23 26   GLUCOSE 136* 118* 108*  BUN 19 17 18   CREATININE 1.05 0.86 0.97  CALCIUM 9.3 8.9 9.0  MG  --  2.1 2.4  PHOS  --  4.0 5.4*   GFR: Estimated Creatinine Clearance: 99.3 mL/min (by C-G formula based on SCr of 0.97 mg/dL). Liver Function Tests: Recent Labs  Lab 04/04/21 1539 04/05/21 0500  AST 41 37  ALT 47* 48*  ALKPHOS 67 52  BILITOT 0.6 0.9  PROT 8.4* 7.5  ALBUMIN 4.3 3.7   No results for input(s): LIPASE, AMYLASE in the last 168 hours. No results for input(s): AMMONIA in the last 168 hours. Coagulation Profile: Recent Labs  Lab 04/04/21 1539  INR 1.0   Cardiac Enzymes: No results for input(s): CKTOTAL, CKMB, CKMBINDEX, TROPONINI in the last 168 hours. BNP (last 3 results) No results for input(s): PROBNP in the last 8760 hours. HbA1C: Recent Labs    04/04/21 1542  HGBA1C 6.1*   CBG: Recent Labs  Lab 04/05/21 1717 04/05/21 2149 04/05/21 2259 04/06/21 0815 04/06/21 1141  GLUCAP 103* 121* 129* 138* 116*   Lipid Profile: No results for input(s): CHOL, HDL, LDLCALC, TRIG, CHOLHDL, LDLDIRECT in the last 72 hours. Thyroid Function Tests: Recent Labs    04/05/21 0500  TSH 2.981   Anemia Panel: No results for input(s): VITAMINB12, FOLATE, FERRITIN, TIBC, IRON, RETICCTPCT in the last 72 hours. Sepsis  Labs: Recent Labs  Lab 04/04/21 1539 04/04/21 1657  LATICACIDVEN 1.2 1.5    Recent Results (from the past 240 hour(s))  Blood Culture (routine x 2)     Status: None (Preliminary result)   Collection Time: 04/04/21  3:40 PM   Specimen: BLOOD  Result Value Ref Range Status   Specimen Description   Final    BLOOD LEFT ANTECUBITAL Performed at [facility], [address], [place], [place]    Special Requests   Final    BOTTLES DRAWN AEROBIC AND ANAEROBIC Blood Culture adequate volume Performed at [facility], [address] [place], [place]    Culture   Final    NO GROWTH 2 DAYS Performed at [facility], [address], [place],  [place] [address]    Report Status PENDING  Incomplete  Resp Panel by RT-PCR (Flu A&B, Covid) Nasopharyngeal Swab     Status: None   Collection Time: 04/04/21  3:43 PM   Specimen: Nasopharyngeal Swab; Nasopharyngeal(NP) swabs in vial transport medium  Result Value Ref Range Status   SARS Coronavirus 2 by RT PCR NEGATIVE NEGATIVE Final    Comment: (NOTE) SARS-CoV-2 target nucleic acids are NOT DETECTED.  The SARS-CoV-2 RNA is generally detectable in upper respiratory specimens during the acute phase of infection. The lowest concentration of SARS-CoV-2 viral copies this assay can detect is 138 copies/mL. A negative result does not preclude SARS-Cov-2 infection and should not be used as the sole basis for treatment or other patient management decisions. A negative result may occur with  improper specimen collection/handling, submission of specimen other than nasopharyngeal swab, presence of viral mutation(s) within the areas targeted by this assay, and inadequate number of viral copies(<138 copies/mL). A negative result must be combined with clinical observations, patient history, and epidemiological information. The expected result is Negative.  Fact Sheet for Patients:   BloggerCourse.com  Fact Sheet for Healthcare Providers:  SeriousBroker.it  This test is no t yet approved or cleared by the [place] FDA and  has been authorized for detection and/or diagnosis of SARS-CoV-2 by FDA under an Emergency Use Authorization (EUA). This EUA will remain  in effect (meaning this test can be used) for the duration of the COVID-19 declaration under Section 564(b)(1) of the Act, 21 U.S.C.section 360bbb-3(b)(1), unless the authorization is terminated  or revoked sooner.       Influenza A by PCR NEGATIVE NEGATIVE Final   Influenza B by PCR NEGATIVE NEGATIVE Final    Comment: (NOTE) The Xpert Xpress SARS-CoV-2/FLU/RSV plus assay is intended as an aid in the diagnosis of influenza from Nasopharyngeal swab specimens and should not be used as a sole basis for treatment. Nasal washings and aspirates are unacceptable for Xpert Xpress SARS-CoV-2/FLU/RSV testing.  Fact Sheet for Patients: BloggerCourse.com  Fact Sheet for Healthcare Providers: SeriousBroker.it  This test is not yet approved or cleared by the [place] FDA and has been authorized for detection and/or diagnosis of SARS-CoV-2 by FDA under an Emergency Use Authorization (EUA). This EUA will remain in effect (meaning this test can be used) for the duration of the COVID-19 declaration under Section 564(b)(1) of the Act, 21 U.S.C. section 360bbb-3(b)(1), unless the authorization is terminated or revoked.  Performed at [facility], [address], [place], [place] [address]   Blood Culture (routine x 2)     Status: None (Preliminary result)   Collection Time: 04/04/21  4:57 PM   Specimen: Site Not Specified; Blood  Result Value Ref Range Status   Specimen Description   Final    SITE NOT SPECIFIED Performed at [facility], [address] [place], [place] [address]     Special Requests   Final    BOTTLES DRAWN AEROBIC AND ANAEROBIC Blood Culture results may not be optimal due to an inadequate volume of blood received in culture bottles Performed at [facility], [address], [place], [place] [address]    Culture   Final    NO GROWTH 2 DAYS Performed at [facility], [address], [place], [place] [address]    Report Status PENDING  Incomplete  MRSA Next Gen by PCR, Nasal     Status: None   Collection Time: 04/05/21 12:29 AM  Specimen: Nasal Mucosa; Nasal Swab  Result Value Ref Range Status   MRSA by PCR Next Gen NOT DETECTED NOT DETECTED Final    Comment: (NOTE) The GeneXpert MRSA Assay (FDA approved for NASAL specimens only), is one component of a comprehensive MRSA colonization surveillance program. It is not intended to diagnose MRSA infection nor to guide or monitor treatment for MRSA infections. Test performance is not FDA approved in patients less than 7 years old. Performed at [facility], [place] [address], [place], [place] [address]     Radiology Studies: DG Foot Complete Left  Result Date: 04/04/2021 CLINICAL DATA:  Left foot wound. EXAM: LEFT FOOT - COMPLETE 3+ VIEW COMPARISON:  None. FINDINGS: There is no evidence of fracture or dislocation. There is no evidence of arthropathy or other focal bone abnormality. Soft tissues are unremarkable. IMPRESSION: Negative. Electronically Signed   By: Marijo Conception M.D.   On: 04/04/2021 15:31    Scheduled Meds:  enoxaparin (LOVENOX) injection  40 mg Subcutaneous Q24H   hydrALAZINE  10 mg Oral BID   insulin aspart  0-5 Units Subcutaneous QHS   insulin aspart  0-9 Units Subcutaneous TID WC   senna-docusate  2 tablet Oral QHS   silver sulfADIAZINE   Topical Daily   Continuous Infusions:   ceFAZolin (ANCEF) IV 1 g (04/06/21 [id number])   lactated ringers Stopped (04/05/21 1844)     LOS: 2 days    Time spent: 25 mins    Shane Badeaux,  MD [facility]   If 7PM-7AM, please contact night-coverage progre

## 2021-04-07 LAB — GLUCOSE, CAPILLARY: Glucose-Capillary: 104 mg/dL — ABNORMAL HIGH (ref 70–99)

## 2021-04-07 MED ORDER — SILVER SULFADIAZINE 1 % EX CREA: TOPICAL_CREAM | Freq: Every day | CUTANEOUS | 0 refills | Status: AC

## 2021-04-07 MED ORDER — CEPHALEXIN 500 MG PO CAPS: 500.0000 mg | ORAL_CAPSULE | Freq: Three times a day (TID) | ORAL | 0 refills | Status: AC

## 2021-04-07 NOTE — TOC Progression Note (Signed)
Transition of Care [facility]) - Progression Note    Patient Details  Name: Scott Nicholson MRN: [id number] Date of Birth: Aug 01, 1975  [facility] CM/SW Contact  Geni Bers, RN Phone Number: 04/07/2021, 12:20 PM  Clinical Narrative:    Appointment on Monday 12/12 at 10 AM/[facility]. Pt made aware. Medications are under $20.00 pt can afford.    Expected Discharge Plan:  (TBD) Barriers to Discharge: Continued Medical Work up  Expected Discharge Plan and Services Expected Discharge Plan:  (TBD) In-house Referral: Clinical Social Work   Post Acute Care Choice:  (TBD) Living arrangements for the past 2 months: Single Family Home Expected Discharge Date: 04/07/21                                     Social Determinants of Health (SDOH) Interventions    Readmission Risk Interventions No flowsheet data found.

## 2021-04-07 NOTE — Discharge Summary (Signed)
Physician Discharge Summary  Ghali Morissette GEX:[id number] DOB: 1975-12-02 DOA: 04/04/2021  PCP: Pcp, No  Admit date: 04/04/2021  Discharge date: 04/07/2021  Admitted From: Home. Disposition:  Home  Recommendations for Outpatient Follow-up:  Follow up with PCP in 1-2 weeks. Please obtain BMP/CBC in one week. Advised to take Keflex 500 mg 3 times daily for next 4 days to complete 7-day treatment. Advised to follow-up in urgent care until finds a PCP.  Home Health: None Equipment/Devices: None  Discharge Condition: Stable CODE STATUS:Full code Diet recommendation: Heart Healthy  Brief [facility] Course: This 45 years old male with PMH significant for type 2 diabetes, hypertension, COVID-19 pneumonia in 2020 (hospitalized for 7 days), presented in the ED with sudden onset of left thigh swelling, redness and pain for 1 day associated with subjective fever and chills for past 5 days.  Patient reports he noticed a lesion in the plantar region of his left foot and thinks it might have originated from wearing wet boots 3 days prior.  He tried to treat his left foot with over-the-counter topical agent without improvement. He does not have a primary care physician due to lack of insurance. Patient was admitted in the hospital for left thigh cellulitis with open wound in the left bottom of foot. Patient was started on IV antibiotics.  Blood cultures no growth so far, cellulitis has significantly resolved.  Wound care consulted given open wound on the bottom of foot advise local sulfadiazine ointment.  Patient feels better , wound looks better.  Patient wants to be discharged and is being discharged home on Keflex 500 mg 3 times daily for next 4 days to complete 7-day treatment.  Advised to follow-up with primary care physician.  He was managed for below problems.  Discharge Diagnoses:  Principal Problem:   Cellulitis Active Problems:   Cellulitis of left leg  Left lower extremity  cellulitis POA: > Resolved. Patient presented with redness, swelling and pain in the left thigh. It seems like cellulitis. Continue empiric IV antibiotics Ancef. Blood cultures no growth so far. Adequate pain control.  Continue IV hydration.   Left foot wound POA: X-ray left foot negative for any abnormality. Wound care consulted follow-up recommendation Local wound care with silver sulfadiazine.   Type 2 diabetes with hyperglycemia: Not on oral hypoglycemic due to lack of insurance. Hemoglobin A1c 6.1. Continue regular insulin sliding scale.   Diabetic coordinator consult. Patient needs outpatient follow-up with PCP to recheck A1c.   Essential hypertension: Not on any oral hypertensives due to lack of insurance. Continue hydralazine as needed. Blood pressure remains controlled off medication.   Sinus bradycardia: Heart rate ranges between 48-52 TSH normal.   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 Avoid nephrotoxic medication, gentle IV hydration  Discharge Instructions  Discharge Instructions     Call MD for:  persistant dizziness or light-headedness   Complete by: As directed    Call MD for:  persistant nausea and vomiting   Complete by: As directed    Call MD for:  redness, tenderness, or signs of infection (pain, swelling, redness, odor or green/yellow discharge around incision site)   Complete by: As directed    Diet - low sodium heart healthy   Complete by: As directed    Diet Carb Modified   Complete by: As directed    Discharge instructions   Complete by: As directed    Advised to follow-up with primary care physician in 1 week. Advised to take Keflex 500 mg  3 times daily for next 4 days to complete 7-day treatment. Advised to follow-up in urgent care and then find a PCP.   Discharge wound care:   Complete by: As directed    Apply daily dressing.   Increase activity slowly   Complete by: As directed       Allergies as of 04/07/2021   No Known  Allergies      Medication List     STOP taking these medications    metFORMIN 850 MG tablet Commonly known as: GLUCOPHAGE   triamcinolone 0.025 % ointment Commonly known as: KENALOG       TAKE these medications    ascorbic acid 1000 MG tablet Commonly known as: VITAMIN C Take 0.5 tablets (500 mg total) by mouth daily. Take for 10 days   cephALEXin 500 MG capsule Commonly known as: KEFLEX Take 1 capsule (500 mg total) by mouth 3 (three) times daily for 4 days.   multivitamin with minerals tablet Take 1 tablet by mouth daily.   silver sulfADIAZINE 1 % cream Commonly known as: SILVADENE Apply topically daily.   zinc sulfate 220 (50 Zn) MG capsule Take 1 capsule (220 mg total) by mouth daily. Take for 10 days               Discharge Care Instructions  (From admission, onward)           Start     Ordered   04/07/21 0000  Discharge wound care:       Comments: Apply daily dressing.   04/07/21 0959            Follow-up Information     Garwin Brothers, MD Follow up in 1 week(s).   Specialty: Internal Medicine Contact information: [address] [place] [address] [place] Follow up.   Specialty: Internal Medicine Why: Appointment 04/10/2021 at 10 AM. Please keep this appointment. Contact information: [place] [phone]               No Known Allergies  Consultations: None   Procedures/Studies: DG Foot Complete Left  Result Date: 04/04/2021 CLINICAL DATA:  Left foot wound. EXAM: LEFT FOOT - COMPLETE 3+ VIEW COMPARISON:  None. FINDINGS: There is no evidence of fracture or dislocation. There is no evidence of arthropathy or other focal bone abnormality. Soft tissues are unremarkable. IMPRESSION: Negative. Electronically Signed   By: Marijo Conception M.D.   On: 04/04/2021 15:31      Subjective: Patient is seen and examined at bedside.   Overnight events noted.  Patient reports feeling much better.  Left thigh cellulitis is resolved, left foot wound is getting better and getting dry.  Patient wants to be discharged.  Discharge Exam: Vitals:   04/06/21 2127 04/07/21 0513  BP: 119/86 117/85  Pulse: (!) 54 (!) 52  Resp: 18 18  Temp: 98.4 F (36.9 C) 98.2 F (36.8 C)  SpO2: 98% 98%   Vitals:   04/06/21 1019 04/06/21 1353 04/06/21 2127 04/07/21 0513  BP: 140/86 126/77 119/86 117/85  Pulse:  (!) 59 (!) 54 (!) 52  Resp:  20 18 18   Temp:  98.3 F (36.8 C) 98.4 F (36.9 C) 98.2 F (36.8 C)  TempSrc:  Oral Oral Oral  SpO2:  100% 98% 98%  Weight:      Height:  General: Pt is alert, awake, not in acute distress Cardiovascular: RRR, S1/S2 +, no rubs, no gallops Respiratory: CTA bilaterally, no wheezing, no rhonchi Abdominal: Soft, NT, ND, bowel sounds + Extremities: Left thigh cellulitis resolved, left foot wound improving.    The results of significant diagnostics from this hospitalization (including imaging, microbiology, ancillary and laboratory) are listed below for reference.     Microbiology: Recent Results (from the past 240 hour(s))  Blood Culture (routine x 2)     Status: None (Preliminary result)   Collection Time: 04/04/21  3:40 PM   Specimen: BLOOD  Result Value Ref Range Status   Specimen Description   Final    BLOOD LEFT ANTECUBITAL Performed at [facility] [address], [place], [place] [address]    Special Requests   Final    BOTTLES DRAWN AEROBIC AND ANAEROBIC Blood Culture adequate volume Performed at [facility] [address] [place], [place] [address]    Culture   Final    NO GROWTH 3 DAYS Performed at [facility], [place] [address] [place], [place] [address]    Report Status PENDING  Incomplete  Resp Panel by RT-PCR (Flu A&B, Covid) Nasopharyngeal Swab     Status: None   Collection Time: 04/04/21  3:43 PM   Specimen:  Nasopharyngeal Swab; Nasopharyngeal(NP) swabs in vial transport medium  Result Value Ref Range Status   SARS Coronavirus 2 by RT PCR NEGATIVE NEGATIVE Final    Comment: (NOTE) SARS-CoV-2 target nucleic acids are NOT DETECTED.  The SARS-CoV-2 RNA is generally detectable in upper respiratory specimens during the acute phase of infection. The lowest concentration of SARS-CoV-2 viral copies this assay can detect is 138 copies/mL. A negative result does not preclude SARS-Cov-2 infection and should not be used as the sole basis for treatment or other patient management decisions. A negative result may occur with  improper specimen collection/handling, submission of specimen other than nasopharyngeal swab, presence of viral mutation(s) within the areas targeted by this assay, and inadequate number of viral copies(<138 copies/mL). A negative result must be combined with clinical observations, patient history, and epidemiological information. The expected result is Negative.  Fact Sheet for Patients:  EntrepreneurPulse.com.au  Fact Sheet for Healthcare Providers:  IncredibleEmployment.be  This test is no t yet approved or cleared by the [place] FDA and  has been authorized for detection and/or diagnosis of SARS-CoV-2 by FDA under an Emergency Use Authorization (EUA). This EUA will remain  in effect (meaning this test can be used) for the duration of the COVID-19 declaration under Section 564(b)(1) of the Act, 21 U.S.C.section 360bbb-3(b)(1), unless the authorization is terminated  or revoked sooner.       Influenza A by PCR NEGATIVE NEGATIVE Final   Influenza B by PCR NEGATIVE NEGATIVE Final    Comment: (NOTE) The Xpert Xpress SARS-CoV-2/FLU/RSV plus assay is intended as an aid in the diagnosis of influenza from Nasopharyngeal swab specimens and should not be used as a sole basis for treatment. Nasal washings and aspirates are unacceptable for  Xpert Xpress SARS-CoV-2/FLU/RSV testing.  Fact Sheet for Patients: EntrepreneurPulse.com.au  Fact Sheet for Healthcare Providers: IncredibleEmployment.be  This test is not yet approved or cleared by the [place] FDA and has been authorized for detection and/or diagnosis of SARS-CoV-2 by FDA under an Emergency Use Authorization (EUA). This EUA will remain in effect (meaning this test can be used) for the duration of the COVID-19 declaration under Section 564(b)(1) of the  Act, 21 U.S.C. section 360bbb-3(b)(1), unless the authorization is terminated or revoked.  Performed at [facility], [place] [address], [place], [place] [address]   Blood Culture (routine x 2)     Status: None (Preliminary result)   Collection Time: 04/04/21  4:57 PM   Specimen: Site Not Specified; Blood  Result Value Ref Range Status   Specimen Description   Final    SITE NOT SPECIFIED Performed at [facility], [place] [address] [place], [place] [address]    Special Requests   Final    BOTTLES DRAWN AEROBIC AND ANAEROBIC Blood Culture results may not be optimal due to an inadequate volume of blood received in culture bottles Performed at [facility] [address] [place], [place] [address]    Culture   Final    NO GROWTH 3 DAYS Performed at [facility], [place] [address], [place], [place] [address]    Report Status PENDING  Incomplete  MRSA Next Gen by PCR, Nasal     Status: None   Collection Time: 04/05/21 12:29 AM   Specimen: Nasal Mucosa; Nasal Swab  Result Value Ref Range Status   MRSA by PCR Next Gen NOT DETECTED NOT DETECTED Final    Comment: (NOTE) The GeneXpert MRSA Assay (FDA approved for NASAL specimens only), is one component of a comprehensive MRSA colonization surveillance program. It is not intended to diagnose MRSA infection nor to guide or monitor treatment for MRSA infections. Test performance is  not FDA approved in patients less than 5 years old. Performed at [facility], [place] [address] [place], [place] [address]      Labs: BNP (last 3 results) No results for input(s): BNP in the last 8760 hours. Basic Metabolic Panel: Recent Labs  Lab 04/04/21 1539 04/05/21 0500 04/06/21 0358  NA 139 137 137  K 4.2 3.9 3.9  CL 104 107 103  CO2 27 23 26   GLUCOSE 136* 118* 108*  BUN 19 17 18   CREATININE 1.05 0.86 0.97  CALCIUM 9.3 8.9 9.0  MG  --  2.1 2.4  PHOS  --  4.0 5.4*   Liver Function Tests: Recent Labs  Lab 04/04/21 1539 04/05/21 0500  AST 41 37  ALT 47* 48*  ALKPHOS 67 52  BILITOT 0.6 0.9  PROT 8.4* 7.5  ALBUMIN 4.3 3.7   No results for input(s): LIPASE, AMYLASE in the last 168 hours. No results for input(s): AMMONIA in the last 168 hours. CBC: Recent Labs  Lab 04/04/21 1539 04/05/21 0500 04/06/21 0358  WBC 8.5 7.5 8.0  NEUTROABS 5.3  --   --   HGB 15.1 14.6 14.7  HCT 45.0 43.4 43.2  MCV 88.8 89.1 88.5  PLT 242 237 241   Cardiac Enzymes: No results for input(s): CKTOTAL, CKMB, CKMBINDEX, TROPONINI in the last 168 hours. BNP: Invalid input(s): POCBNP CBG: Recent Labs  Lab 04/06/21 0815 04/06/21 1141 04/06/21 1652 04/06/21 2132 04/07/21 0736  GLUCAP 138* 116* 123* 103* 104*   D-Dimer No results for input(s): DDIMER in the last 72 hours. Hgb A1c Recent Labs    04/04/21 1542  HGBA1C 6.1*   Lipid Profile No results for input(s): CHOL, HDL, LDLCALC, TRIG, CHOLHDL, LDLDIRECT in the last 72 hours. Thyroid function studies Recent Labs    04/05/21 0500  TSH 2.981   Anemia work up No results for input(s): VITAMINB12, FOLATE, FERRITIN, TIBC, IRON, RETICCTPCT in the last 72 hours. Urinalysis  Component Value Date/Time   COLORURINE YELLOW 04/04/2021 1646   APPEARANCEUR CLEAR 04/04/2021 1646   LABSPEC 1.025 04/04/2021 1646   PHURINE 6.0 04/04/2021 1646   GLUCOSEU NEGATIVE 04/04/2021 1646   HGBUR NEGATIVE 04/04/2021  1646   BILIRUBINUR NEGATIVE 04/04/2021 1646   BILIRUBINUR neg 11/13/2018 0844   KETONESUR NEGATIVE 04/04/2021 1646   PROTEINUR NEGATIVE 04/04/2021 1646   UROBILINOGEN 0.2 11/13/2018 0844   NITRITE NEGATIVE 04/04/2021 1646   LEUKOCYTESUR NEGATIVE 04/04/2021 1646   Sepsis Labs Invalid input(s): PROCALCITONIN,  WBC,  LACTICIDVEN Microbiology Recent Results (from the past 240 hour(s))  Blood Culture (routine x 2)     Status: None (Preliminary result)   Collection Time: 04/04/21  3:40 PM   Specimen: BLOOD  Result Value Ref Range Status   Specimen Description   Final    BLOOD LEFT ANTECUBITAL Performed at [facility], [place] [address], [place], [place] [address]    Special Requests   Final    BOTTLES DRAWN AEROBIC AND ANAEROBIC Blood Culture adequate volume Performed at [facility] [address], [place], [place] [address]    Culture   Final    NO GROWTH 3 DAYS Performed at [facility], [place] [address] [place],  [address]    Report Status PENDING  Incomplete  Resp Panel by RT-PCR (Flu A&B, Covid) Nasopharyngeal Swab     Status: None   Collection Time: 04/04/21  3:43 PM   Specimen: Nasopharyngeal Swab; Nasopharyngeal(NP) swabs in vial transport medium  Result Value Ref Range Status   SARS Coronavirus 2 by RT PCR NEGATIVE NEGATIVE Final    Comment: (NOTE) SARS-CoV-2 target nucleic acids are NOT DETECTED.  The SARS-CoV-2 RNA is generally detectable in upper respiratory specimens during the acute phase of infection. The lowest concentration of SARS-CoV-2 viral copies this assay can detect is 138 copies/mL. A negative result does not preclude SARS-Cov-2 infection and should not be used as the sole basis for treatment or other patient management decisions. A negative result may occur with  improper specimen collection/handling, submission of specimen other than nasopharyngeal swab, presence of viral mutation(s) within the areas  targeted by this assay, and inadequate number of viral copies(<138 copies/mL). A negative result must be combined with clinical observations, patient history, and epidemiological information. The expected result is Negative.  Fact Sheet for Patients:  EntrepreneurPulse.com.au  Fact Sheet for Healthcare Providers:  IncredibleEmployment.be  This test is no t yet approved or cleared by the [place] FDA and  has been authorized for detection and/or diagnosis of SARS-CoV-2 by FDA under an Emergency Use Authorization (EUA). This EUA will remain  in effect (meaning this test can be used) for the duration of the COVID-19 declaration under Section 564(b)(1) of the Act, 21 U.S.C.section 360bbb-3(b)(1), unless the authorization is terminated  or revoked sooner.       Influenza A by PCR NEGATIVE NEGATIVE Final   Influenza B by PCR NEGATIVE NEGATIVE Final    Comment: (NOTE) The Xpert Xpress SARS-CoV-2/FLU/RSV plus assay is intended as an aid in the diagnosis of influenza from Nasopharyngeal swab specimens and should not be used as a sole basis for treatment. Nasal washings and aspirates are unacceptable for Xpert Xpress SARS-CoV-2/FLU/RSV testing.  Fact Sheet for Patients: EntrepreneurPulse.com.au  Fact Sheet for Healthcare Providers: IncredibleEmployment.be  This test is not yet approved or cleared by the [place] FDA and has been authorized for detection and/or diagnosis of SARS-CoV-2 by FDA under an Emergency Use  Authorization (EUA). This EUA will remain in effect (meaning this test can be used) for the duration of the COVID-19 declaration under Section 564(b)(1) of the Act, 21 U.S.C. section 360bbb-3(b)(1), unless the authorization is terminated or revoked.  Performed at [facility], [place] [address] [place], [place] [address]   Blood Culture (routine x 2)     Status: None  (Preliminary result)   Collection Time: 04/04/21  4:57 PM   Specimen: Site Not Specified; Blood  Result Value Ref Range Status   Specimen Description   Final    SITE NOT SPECIFIED Performed at [facility], [place] [address], [place], [place] [address]    Special Requests   Final    BOTTLES DRAWN AEROBIC AND ANAEROBIC Blood Culture results may not be optimal due to an inadequate volume of blood received in culture bottles Performed at [facility] [address] [place], [place] [address]    Culture   Final    NO GROWTH 3 DAYS Performed at [facility], [place] [address], [place], [place] [address]    Report Status PENDING  Incomplete  MRSA Next Gen by PCR, Nasal     Status: None   Collection Time: 04/05/21 12:29 AM   Specimen: Nasal Mucosa; Nasal Swab  Result Value Ref Range Status   MRSA by PCR Next Gen NOT DETECTED NOT DETECTED Final    Comment: (NOTE) The GeneXpert MRSA Assay (FDA approved for NASAL specimens only), is one component of a comprehensive MRSA colonization surveillance program. It is not intended to diagnose MRSA infection nor to guide or monitor treatment for MRSA infections. Test performance is not FDA approved in patients less than 45 years old. Performed at [facility], [place] [address], [place], [place] [address]      Time coordinating discharge: Over 30 minutes  SIGNED:   Shawna Clamp, MD  [facility] 04/07/2021, 1:40 PM   If 7PM-7AM, please contact night-coverage

## 2021-04-07 NOTE — Progress Notes (Signed)
Patient discharging home, IV removed - WNL.  Reviewed AVS and medications - patient verbalizes understanding.  Follow up appt made with patient health center per CM - patient aware, but states he may be able to get set up with an office in [place] with an afternoon appt. Time.  He is waiting for a call back - instructed patient to keep current appt. Until something else is scheduled, and to call health care center to cancel if needed.  Patient assisted off unit via WC in NAD

## 2021-04-07 NOTE — Discharge Instructions (Signed)
Advised to follow-up with primary care physician in 1 week. Advised to take Keflex 500 mg 3 times daily for next 4 days to complete 7-day treatment. Advised to follow-up in urgent care and then find a PCP.

## 2021-04-09 LAB — CULTURE, BLOOD (ROUTINE X 2)
Culture: NO GROWTH
Culture: NO GROWTH
Special Requests: ADEQUATE

## 2021-04-10 ENCOUNTER — Ambulatory Visit: Payer: Self-pay | Admitting: Nurse Practitioner
